# Patient Record
Sex: Female | Born: 1990 | Race: White | Hispanic: No | Smoking: Current every day smoker
Health system: Southern US, Community
[De-identification: ages and names within clinical notes are randomized; demographics above are authoritative.]

## PROBLEM LIST (undated history)

## (undated) ENCOUNTER — Inpatient Hospital Stay: Payer: Self-pay

## (undated) ENCOUNTER — Emergency Department: Admission: EM

## (undated) DIAGNOSIS — E538 Deficiency of other specified B group vitamins: Secondary | ICD-10-CM

## (undated) DIAGNOSIS — R569 Unspecified convulsions: Secondary | ICD-10-CM

## (undated) DIAGNOSIS — F419 Anxiety disorder, unspecified: Secondary | ICD-10-CM

## (undated) DIAGNOSIS — N760 Acute vaginitis: Secondary | ICD-10-CM

## (undated) DIAGNOSIS — Z8759 Personal history of other complications of pregnancy, childbirth and the puerperium: Secondary | ICD-10-CM

## (undated) DIAGNOSIS — B9689 Other specified bacterial agents as the cause of diseases classified elsewhere: Secondary | ICD-10-CM

## (undated) DIAGNOSIS — N809 Endometriosis, unspecified: Secondary | ICD-10-CM

## (undated) DIAGNOSIS — G40909 Epilepsy, unspecified, not intractable, without status epilepticus: Secondary | ICD-10-CM

## (undated) DIAGNOSIS — D649 Anemia, unspecified: Secondary | ICD-10-CM

## (undated) DIAGNOSIS — A599 Trichomoniasis, unspecified: Secondary | ICD-10-CM

## (undated) HISTORY — DX: Acute vaginitis: B96.89

## (undated) HISTORY — PX: OTHER SURGICAL HISTORY: SHX169

## (undated) HISTORY — DX: Anemia, unspecified: D64.9

## (undated) HISTORY — DX: Acute vaginitis: N76.0

## (undated) HISTORY — PX: NO PAST SURGERIES: SHX2092

## (undated) HISTORY — DX: Trichomoniasis, unspecified: A59.9

## (undated) HISTORY — DX: Personal history of other complications of pregnancy, childbirth and the puerperium: Z87.59

## (undated) HISTORY — PX: WISDOM TOOTH EXTRACTION: SHX21

## (undated) NOTE — *Deleted (*Deleted)
I value your feedback and entrusting us with your care. If you get a [facility] patient survey, I would appreciate you taking the time to let us know about your experience today. Thank you!  As of October 16, 2019, your lab results will be released to your MyChart immediately, before I even have a chance to see them. Please give me time to review them and contact you if there are any abnormalities. Thank you for your patience.  

---
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
## 2010-01-23 ENCOUNTER — Emergency Department: Payer: Self-pay | Admitting: Unknown Physician Specialty

## 2010-01-24 ENCOUNTER — Emergency Department: Payer: Self-pay | Admitting: Emergency Medicine

## 2010-11-25 ENCOUNTER — Emergency Department: Payer: Self-pay | Admitting: Emergency Medicine

## 2011-03-02 ENCOUNTER — Emergency Department: Payer: Self-pay | Admitting: Emergency Medicine

## 2011-04-06 ENCOUNTER — Emergency Department: Payer: Self-pay | Admitting: Emergency Medicine

## 2011-08-18 ENCOUNTER — Emergency Department: Payer: Self-pay | Admitting: Emergency Medicine

## 2011-11-05 ENCOUNTER — Observation Stay: Payer: Self-pay

## 2011-12-03 ENCOUNTER — Observation Stay: Payer: Self-pay | Admitting: Obstetrics and Gynecology

## 2011-12-03 LAB — URINALYSIS, COMPLETE
Bacteria: NONE SEEN
Blood: NEGATIVE
Ketone: NEGATIVE
Leukocyte Esterase: NEGATIVE
Ph: 7 (ref 4.5–8.0)
RBC,UR: <1 /HPF (ref 0–5)
Specific Gravity: 1.003 (ref 1.003–1.030)
Squamous Epithelial: <1
WBC UR: <1 /HPF (ref 0–5)

## 2011-12-17 ENCOUNTER — Observation Stay: Payer: Self-pay | Admitting: Obstetrics and Gynecology

## 2011-12-17 LAB — URINALYSIS, COMPLETE
Bacteria: NONE SEEN
Bilirubin,UR: NEGATIVE
Blood: NEGATIVE
Ketone: NEGATIVE
Leukocyte Esterase: NEGATIVE
Nitrite: NEGATIVE
Ph: 7 (ref 4.5–8.0)
RBC,UR: NONE SEEN /HPF (ref 0–5)
Specific Gravity: 1.002 (ref 1.003–1.030)
Squamous Epithelial: <1
WBC UR: 1 /HPF (ref 0–5)

## 2011-12-19 ENCOUNTER — Observation Stay: Payer: Self-pay

## 2011-12-23 ENCOUNTER — Observation Stay: Payer: Self-pay

## 2012-01-04 ENCOUNTER — Observation Stay: Payer: Self-pay | Admitting: Obstetrics & Gynecology

## 2012-01-06 ENCOUNTER — Observation Stay: Payer: Self-pay | Admitting: Obstetrics and Gynecology

## 2012-01-07 ENCOUNTER — Inpatient Hospital Stay: Payer: Self-pay

## 2012-01-07 LAB — CBC WITH DIFFERENTIAL/PLATELET
Basophil #: 0 10*3/uL (ref 0.0–0.1)
Eosinophil %: 0.6 %
HGB: 10.9 g/dL — ABNORMAL LOW (ref 12.0–16.0)
Lymphocyte #: 1.5 10*3/uL (ref 1.0–3.6)
Lymphocyte %: 9.8 %
Monocyte #: 1.2 10*3/uL — ABNORMAL HIGH (ref 0.0–0.7)
Monocyte %: 8.1 %
Neutrophil #: 12.5 10*3/uL — ABNORMAL HIGH (ref 1.4–6.5)
Neutrophil %: 81.3 %
Platelet: 171 10*3/uL (ref 150–440)
RBC: 3.22 10*6/uL — ABNORMAL LOW (ref 3.80–5.20)
RDW: 13.9 % (ref 11.5–14.5)
WBC: 15.3 10*3/uL — ABNORMAL HIGH (ref 3.6–11.0)

## 2012-01-09 LAB — HEMATOCRIT: HCT: 31 % — ABNORMAL LOW (ref 35.0–47.0)

## 2012-09-29 ENCOUNTER — Emergency Department: Payer: Self-pay | Admitting: Emergency Medicine

## 2012-09-29 LAB — URINALYSIS, COMPLETE
Bilirubin,UR: NEGATIVE
Glucose,UR: NEGATIVE mg/dL (ref 0–75)
Ketone: NEGATIVE
Leukocyte Esterase: NEGATIVE
Nitrite: NEGATIVE
Ph: 7 (ref 4.5–8.0)
Protein: NEGATIVE
RBC,UR: 4 /HPF (ref 0–5)
Squamous Epithelial: <1
WBC UR: 2 /HPF (ref 0–5)

## 2012-09-29 LAB — COMPREHENSIVE METABOLIC PANEL
Albumin: 4.2 g/dL (ref 3.4–5.0)
Alkaline Phosphatase: 92 U/L (ref 50–136)
Anion Gap: 4 — ABNORMAL LOW (ref 7–16)
BUN: 10 mg/dL (ref 7–18)
Bilirubin,Total: 0.5 mg/dL (ref 0.2–1.0)
EGFR (Non-African Amer.): >60
Glucose: 87 mg/dL (ref 65–99)
Osmolality: 274 (ref 275–301)
Potassium: 4.1 mmol/L (ref 3.5–5.1)
SGPT (ALT): 35 U/L (ref 12–78)
Sodium: 138 mmol/L (ref 136–145)
Total Protein: 7.6 g/dL (ref 6.4–8.2)

## 2012-09-29 LAB — CBC
MCH: 33.6 pg (ref 26.0–34.0)
MCV: 95 fL (ref 80–100)
Platelet: 252 10*3/uL (ref 150–440)
RBC: 4.28 10*6/uL (ref 3.80–5.20)
RDW: 12.2 % (ref 11.5–14.5)

## 2012-09-29 LAB — LIPASE, BLOOD: Lipase: 78 U/L (ref 73–393)

## 2012-09-29 LAB — WET PREP, GENITAL

## 2012-10-05 ENCOUNTER — Emergency Department: Payer: Self-pay | Admitting: Emergency Medicine

## 2012-10-05 LAB — URINALYSIS, COMPLETE
Glucose,UR: NEGATIVE mg/dL (ref 0–75)
Ph: 5 (ref 4.5–8.0)
Protein: NEGATIVE
RBC,UR: 3 /HPF (ref 0–5)
Specific Gravity: 1.031 (ref 1.003–1.030)
WBC UR: 5 /HPF (ref 0–5)

## 2012-10-05 LAB — BASIC METABOLIC PANEL
Anion Gap: 7 (ref 7–16)
BUN: 14 mg/dL (ref 7–18)
Co2: 26 mmol/L (ref 21–32)
Creatinine: 0.64 mg/dL (ref 0.60–1.30)
EGFR (African American): >60
Glucose: 114 mg/dL — ABNORMAL HIGH (ref 65–99)
Osmolality: 283 (ref 275–301)
Sodium: 141 mmol/L (ref 136–145)

## 2012-10-05 LAB — CBC
HGB: 14.3 g/dL (ref 12.0–16.0)
MCHC: 34.7 g/dL (ref 32.0–36.0)
Platelet: 240 10*3/uL (ref 150–440)
RDW: 12.3 % (ref 11.5–14.5)
WBC: 15.2 10*3/uL — ABNORMAL HIGH (ref 3.6–11.0)

## 2013-01-21 ENCOUNTER — Emergency Department: Payer: Self-pay | Admitting: Emergency Medicine

## 2014-03-08 ENCOUNTER — Emergency Department: Payer: Self-pay | Admitting: Emergency Medicine

## 2014-10-08 ENCOUNTER — Ambulatory Visit: Payer: Self-pay | Admitting: Emergency Medicine

## 2014-10-12 LAB — WOUND CULTURE

## 2014-10-17 ENCOUNTER — Emergency Department: Payer: Self-pay | Admitting: Emergency Medicine

## 2014-10-21 ENCOUNTER — Encounter: Payer: Self-pay | Admitting: Emergency Medicine

## 2014-11-06 ENCOUNTER — Encounter: Payer: Self-pay | Admitting: Emergency Medicine

## 2015-03-16 NOTE — H&P (Signed)
L&D Evaluation:  History Expanded:   HPI 24yo G3P0 who presents with stronger contractions and is pain, more than before, SROM in L and D,now 4 cm    Gravida 3    Term 0    PreTerm 0    Abortion 2    Living 0    Blood Type O positive    Group B Strep Results (Result >5wks must be treated as unknown) positive    Maternal HIV Negative    Maternal Syphilis Ab Nonreactive    Maternal Varicella Immune    Rubella Results immune    Maternal T-Dap Immune    [facility]    Presents with contractions, leaking fluid    Patient's Medical History psuedoseizure activity since 2008    Patient's Surgical History none    Medications Pre Natal Vitamins    Allergies NKDA    Social History tobacco    Family History Non-Contributory   ROS:   ROS All systems were reviewed.  HEENT, CNS, GI, GU, Respiratory, CV, Renal and Musculoskeletal systems were found to be normal.   Exam:   Vital Signs stable    Urine Protein not completed    General no apparent distress    Mental Status clear    Chest clear    Heart normal sinus rhythm    Abdomen gravid, non-tender    Estimated Fetal Weight Small for gestational age    Fetal Position vertex,4cm 100%    Back no CVAT    Edema no edema    Reflexes 1+    Pelvic no external lesions    Mebranes Intact    FHT normal rate with no decels    Fetal Heart Rate 140    Ucx regular    Ucx Frequency 5 min    Skin dry    Lymph no lymphadenopathy   Impression:   Impression active labor   Plan:   Plan antibiotics for GBBS prophylaxis    Comments will do pitocin to augment labor if she is staLLED AFTER  the amp    Follow Up Appointment need to schedule   Electronic Signatures: Adria DevonKlett, Legrande Hao (MD)  (Signed 04-Mar-13 01:57)  Authored: L&D Evaluation   Last Updated: 04-Mar-13 01:57 by Adria DevonKlett, Jowan Skillin (MD)

## 2015-03-16 NOTE — H&P (Signed)
L&D Evaluation:  History Expanded:   HPI 24yo G3P0 who presents with sronger contractions than normal. she seems to be affected by them, but she is the same on cervical ceck as in the office. she felt a gush of fluid in the shower that was a different temperature thatn the shower and was non colored. she had wiped one tme and saw pink.    Gravida 3    Term 0    PreTerm 0    Abortion 2    Living 0    Blood Type O positive    Group B Strep Results (Result >5wks must be treated as unknown) positive    Maternal HIV Negative    Maternal Syphilis Ab Nonreactive    Maternal Varicella Immune    Rubella Results immune    Maternal T-Dap Immune    [facility]    Presents with contractions, leaking fluid    Patient's Medical History psuedoseizure activity since 2008    Patient's Surgical History none    Medications Pre Natal Vitamins    Allergies NKDA   ROS:   ROS All systems were reviewed.  HEENT, CNS, GI, GU, Respiratory, CV, Renal and Musculoskeletal systems were found to be normal.   Exam:   Vital Signs stable    Urine Protein not completed    General no apparent distress    Mental Status clear    Chest clear    Heart normal sinus rhythm    Abdomen gravid, non-tender    Estimated Fetal Weight Small for gestational age    Fetal Position vertex, 2 cm/7980forebag felt fetus at -3    Back no CVAT    Edema no edema     Reflexes 1+     Pelvic no external lesions    Mebranes Intact    FHT normal rate with no decels    Fetal Heart Rate 140     Ucx irregular    Ucx Frequency 12 min    Skin dry    Lymph no lymphadenopathy    Impression:   Impression early labor   Plan:   Comments offeredpatient morphine or phenergan or zofran she chooses to have zofran to stop the nausea she is having. pt is in early labor and will be discharged to home with zofran, real labor precautions given    Follow Up Appointment need to schedule   Electronic  Signatures: Adria DevonKlett, Jihan Mellette (MD)  (Signed 02-Mar-13 12:16)  Authored: L&D Evaluation   Last Updated: 02-Mar-13 12:16 by Adria DevonKlett, Dimitris Shanahan (MD)

## 2015-03-16 NOTE — H&P (Signed)
L&D Evaluation:  History Expanded:   HPI 24yo G3P0 who presents with stronger contractions and is pain, more than before. she seems to be affected by them, but she is the same on cervical check as this morning. she felt a gush of fluid in the shower that was a different temperature than the shower and was non colored it happened this morning and happened this afternoon again. bag is palpated and is intact.. she had wiped one time and saw pink.    Gravida 3    Term 0    PreTerm 0    Abortion 2    Living 0    Blood Type O positive    Group B Strep Results (Result >5wks must be treated as unknown) positive    Maternal HIV Negative    Maternal Syphilis Ab Nonreactive    Maternal Varicella Immune    Rubella Results immune    Maternal T-Dap Immune    [facility]    Presents with contractions, leaking fluid    Patient's Medical History psuedoseizure activity since 2008    Patient's Surgical History none    Medications Pre Natal Vitamins    Allergies NKDA    Social History tobacco    Family History Non-Contributory   ROS:   ROS All systems were reviewed.  HEENT, CNS, GI, GU, Respiratory, CV, Renal and Musculoskeletal systems were found to be normal.   Exam:   Vital Signs stable    Urine Protein not completed    General no apparent distress    Mental Status clear    Chest clear    Heart normal sinus rhythm    Abdomen gravid, non-tender    Estimated Fetal Weight Small for gestational age    Fetal Position vertex, 2 cm/80 forebag felt fetus at -3    Back no CVAT    Edema no edema    Reflexes 1+    Pelvic no external lesions    Mebranes Intact    FHT normal rate with no decels    Fetal Heart Rate 140    Ucx irregular    Ucx Frequency 12 min    Skin dry    Lymph no lymphadenopathy   Impression:   Impression early labor   Plan:   Comments offered patient morphine or phenergan or zofran she chooses to have zofran to stop the nausea  she is having. The patientss mother does a alot of the talking for her and tells her what she does and does not want.    Follow Up Appointment need to schedule   Electronic Signatures: Adria DevonKlett, Marymargaret Kirker (MD)  (Signed 02-Mar-13 22:40)  Authored: L&D Evaluation   Last Updated: 02-Mar-13 22:40 by Adria DevonKlett, Wilbur Oakland (MD)

## 2015-03-16 NOTE — H&P (Signed)
L&D Evaluation:  History:   HPI 24 yo G3P0020 @ 74101w6d GA by LMP c/w 10 week u/s with an uncomplicated pregnancy. She presents with contractions of increasing frequency and severity since yesterday at noon.  Notes +FM, no LOF, no VB. O+, Pap = ASC+, RI, RPR NR, HBsAg neg, VI, GBS unk.    Presents with contractions    Patient's Medical History No Chronic Illness    Patient's Surgical History none    Medications Pre Natal Vitamins    Allergies NKDA    Social History tobacco    Family History Non-Contributory   ROS:   ROS All systems were reviewed.  HEENT, CNS, GI, GU, Respiratory, CV, Renal and Musculoskeletal systems were found to be normal., Denies urinary and vaginal symptoms   Exam:   Vital Signs stable    General no apparent distress    Mental Status clear    Chest clear    Heart normal sinus rhythm    Abdomen gravid, non-tender    Back no CVAT    Edema no edema    Pelvic FT/Long/-3, negative pooling    Mebranes Intact    FHT normal rate with no decels    FHT Description 130/mod var/+accels/no decels    Ucx irregular, infrequent    Skin no lesions    Other UA: negative for infection. pH 7.0, neg LE, neg nit, 1 epi, no bact Wet mount: neg, KOH: no fungal elements negative pooling and ferning, positive nitrazine (see urine pH)   Impression:   Impression reactive NST, Uterine contractions, no evidence of labor   Plan:   Plan UA, EFM/NST, discharge    Comments Of note: patient thought she had a gush of fluid.  Nitrazine was positive. She immediately had to urinate. No evidence of ROM. Home with precautions.    Follow Up Appointment already scheduled. on Wednesday 2/13   Electronic Signatures: Conard NovakJackson, Zackariah Vanderpol D (MD)  (Signed 10-Feb-13 17:08)  Authored: L&D Evaluation   Last Updated: 10-Feb-13 17:08 by Conard NovakJackson, Steele Stracener D (MD)

## 2016-01-05 ENCOUNTER — Encounter: Payer: Self-pay | Admitting: Emergency Medicine

## 2016-01-05 ENCOUNTER — Emergency Department: Payer: Self-pay

## 2016-01-05 ENCOUNTER — Emergency Department
Admission: EM | Admit: 2016-01-05 | Discharge: 2016-01-05 | Disposition: A | Discharge status: Home / Self Care | Payer: Self-pay | Attending: Emergency Medicine | Admitting: Emergency Medicine

## 2016-01-05 DIAGNOSIS — S6291XA Unspecified fracture of right wrist and hand, initial encounter for closed fracture: Secondary | ICD-10-CM

## 2016-01-05 DIAGNOSIS — Y9389 Activity, other specified: Secondary | ICD-10-CM | POA: Insufficient documentation

## 2016-01-05 DIAGNOSIS — S62316A Displaced fracture of base of fifth metacarpal bone, right hand, initial encounter for closed fracture: Secondary | ICD-10-CM | POA: Insufficient documentation

## 2016-01-05 DIAGNOSIS — W231XXA Caught, crushed, jammed, or pinched between stationary objects, initial encounter: Secondary | ICD-10-CM | POA: Insufficient documentation

## 2016-01-05 DIAGNOSIS — F17200 Nicotine dependence, unspecified, uncomplicated: Secondary | ICD-10-CM | POA: Insufficient documentation

## 2016-01-05 DIAGNOSIS — Y92009 Unspecified place in unspecified non-institutional (private) residence as the place of occurrence of the external cause: Secondary | ICD-10-CM | POA: Insufficient documentation

## 2016-01-05 DIAGNOSIS — Y998 Other external cause status: Secondary | ICD-10-CM | POA: Insufficient documentation

## 2016-01-05 DIAGNOSIS — S62309A Unspecified fracture of unspecified metacarpal bone, initial encounter for closed fracture: Secondary | ICD-10-CM

## 2016-01-05 MED ORDER — OXYCODONE-ACETAMINOPHEN 5-325 MG PO TABS
ORAL_TABLET | ORAL | Status: AC
  Filled 2016-01-05: qty 1

## 2016-01-05 MED ORDER — HYDROCODONE-ACETAMINOPHEN 5-325 MG PO TABS: 1.0000 | ORAL_TABLET | Freq: Four times a day (QID) | ORAL | Status: DC | PRN

## 2016-01-05 MED ORDER — OXYCODONE-ACETAMINOPHEN 5-325 MG PO TABS
1.0000 | ORAL_TABLET | Freq: Once | ORAL | Status: AC
  Administered 2016-01-05: 1 via ORAL

## 2016-01-05 NOTE — ED Notes (Signed)
Slipped in dog pee and hit hand on table, c/o right hand pain.

## 2016-01-05 NOTE — ED Notes (Signed)
Splint applied per order. Patient in stable condition. Cap refill <2 sec; palpable pulse. Education regarding s/sx of adverse reaction complete; patient verbalizes understanding of need to remove splint and seek medical attention for severe pain, tingling, numbness, distal swelling, pallor, coldness, or cyanosis. 

## 2016-01-05 NOTE — Discharge Instructions (Signed)
Cast or Splint Care °Casts and splints support injured limbs and keep bones from moving while they heal.  °HOME CARE °· Keep the cast or splint uncovered during the drying period. °¨ A plaster cast can take 24 to 48 hours to dry. °¨ A fiberglass cast will dry in less than 1 hour. °· Do not rest the cast on anything harder than a pillow for 24 hours. °· Do not put weight on your injured limb. Do not put pressure on the cast. Wait for your doctor's approval. °· Keep the cast or splint dry. °¨ Cover the cast or splint with a plastic bag during baths or wet weather. °¨ If you have a cast over your chest and belly (trunk), take sponge baths until the cast is taken off. °¨ If your cast gets wet, dry it with a towel or blow dryer. Use the cool setting on the blow dryer. °· Keep your cast or splint clean. Wash a dirty cast with a damp cloth. °· Do not put any objects under your cast or splint. °· Do not scratch the skin under the cast with an object. If itching is a problem, use a blow dryer on a cool setting over the itchy area. °· Do not trim or cut your cast. °· Do not take out the padding from inside your cast. °· Exercise your joints near the cast as told by your doctor. °· Raise (elevate) your injured limb on 1 or 2 pillows for the first 1 to 3 days. °GET HELP IF: °· Your cast or splint cracks. °· Your cast or splint is too tight or too loose. °· You itch badly under the cast. °· Your cast gets wet or has a soft spot. °· You have a bad smell coming from the cast. °· You get an object stuck under the cast. °· Your skin around the cast becomes red or sore. °· You have new or more pain after the cast is put on. °GET HELP RIGHT AWAY IF: °· You have fluid leaking through the cast. °· You cannot move your fingers or toes. °· Your fingers or toes turn blue or white or are cool, painful, or puffy (swollen). °· You have tingling or lose feeling (numbness) around the injured area. °· You have bad pain or pressure under the  cast. °· You have trouble breathing or have shortness of breath. °· You have chest pain. °  °This information is not intended to replace advice given to you by your health care provider. Make sure you discuss any questions you have with your health care provider. °  °Document Released: 02/22/2011 Document Revised: 06/25/2013 Document Reviewed: 05/01/2013 °Elsevier Interactive Patient Education ©2016 [employer] ° °Metacarpal Fracture °Fractures of metacarpals are breaks in the bones of the hand. They extend from the knuckles to the wrist. These bones can break in many ways. There are different ways of treating these fractures. °HOME CARE °· Only exercise as told by your doctor. °· Return to activities as told by your doctor. °· Go to physical therapy as told by your doctor. °· Follow your doctor's advice about driving. °· Keep the injured hand raised (elevated) above the level of your heart. °· If a plaster, fiberglass, or pre-formed splint was applied: °¨ Wear your splint as told and until you are examined again. °¨ Apply ice on the injury for 15-20 minutes at a time, 03-04 times a day. Put the ice in a plastic bag. Place a towel between your skin   and the bag.  Do not get your splint or cast wet. Protect it during bathing with a plastic bag.  Loosen the elastic bandage around the splint if your fingers start to get numb, tingle, get cold, or turn blue.  If the splint is plaster, do not lean it on hard surfaces or put pressure on it for 24 hours after it is put on.  Do not  try to scratch the skin under the cast.  Check the skin around the cast every day. You may put lotion on red or sore areas.  Move the fingers of your casted hand several times a day.  Only take medicine as told by your doctor.  Follow up as told by your doctor. This is very important in order to avoid permanent injury, disability, or lasting (chronic) pain. GET HELP RIGHT AWAY IF:   You develop a rash.  You have problems  breathing.  You have any allergy problems.  You have more than a small spot of blood from beneath your cast or splint.  You have redness, puffiness (swelling), or more pain from beneath your cast or splint.  Yellowish-white fluid (pus) comes from beneath your cast or splint.  You develop a temperature by mouth above 102 F (38.9 C), not controlled by medicine.  You have a bad smell coming from under your cast or splint.  You have problems moving any of your fingers. If you do not have a window in your cast for looking at the wound, a fluid or a little bleeding may show up as a stain on the outside of your cast. Tell your doctor about any stains you see. MAKE SURE YOU:   Understand these instructions.  Will watch your condition.  Will get help right away if you are not doing well or get worse.   This information is not intended to replace advice given to you by your health care provider. Make sure you discuss any questions you have with your health care provider.   Document Released: 04/10/2008 Document Revised: 11/13/2014 Document Reviewed: 08/12/2014 Elsevier Interactive Patient Education 2016 [employer].  Take the prescription meds as directed. Apply ice through the splint. Follow-up with Dr. Joice Lofts for further treatment.

## 2016-01-05 NOTE — ED Provider Notes (Signed)
[facility] Emergency Department Provider Note ____________________________________________  Time seen: 1241  I have reviewed the triage vital signs and the nursing notes.  HISTORY  Chief Complaint  Hand Pain  HPI Betty Powell is a 25 y.o. female visits to the ED for evaluation of pain to the right hand after an injury occurred at home today. He describes she slipped on a puddle of dog p.m. the floor, and slammed the side of her right hand against the coffee table. She noted immediate pain and disability to the hand and the lateral aspect. She presents to the ED for evaluation of hand pain, swelling, disability. She denies any other injury at this time.She rates the pain and had 6/10 in triage.  History reviewed. No pertinent past medical history.  There are no active problems to display for this patient.  History reviewed. No pertinent past surgical history.  Current Outpatient Rx  Name  Route  Sig  Dispense  Refill  . HYDROcodone-acetaminophen (NORCO) 5-325 MG tablet   Oral   Take 1 tablet by mouth every 6 (six) hours as needed for moderate pain.   15 tablet   0    Allergies Review of patient's allergies indicates no known allergies.  History reviewed. No pertinent family history.  Social History Social History  Substance Use Topics  . Smoking status: Current Every Day Smoker  . Smokeless tobacco: None  . Alcohol Use: None   Review of Systems  Constitutional: Negative for fever. Cardiovascular: Negative for chest pain. Respiratory: Negative for shortness of breath. Gastrointestinal: Negative for abdominal pain, vomiting and diarrhea. Musculoskeletal: Negative for back pain. Right hand pain as above.  Skin: Negative for rash. Neurological: Negative for headaches, focal weakness or numbness. ____________________________________________  PHYSICAL EXAM:  VITAL SIGNS: ED Triage Vitals  Enc Vitals Group     BP 01/05/16 1031 114/82  mmHg     Pulse Rate 01/05/16 1031 87     Resp 01/05/16 1031 18     Temp 01/05/16 1031 97.9 F (36.6 C)     Temp Source 01/05/16 1031 Oral     SpO2 01/05/16 1031 100 %     Weight 01/05/16 1031 125 lb (56.7 kg)     Height 01/05/16 1031  (1.651 m)     Head Cir --      Peak Flow --      Pain Score 01/05/16 1032 6     Pain Loc --      Pain Edu? --      Excl. in GC? --    Constitutional: Alert and oriented. Well appearing and in no distress. Head: Normocephalic and atraumatic. Eyes: Conjunctivae are normal. PERRL. Normal extraocular movements Hematological/Lymphatic/Immunological: No cervical lymphadenopathy. Cardiovascular: Normal rate, regular rhythm. Normal distal pulses. Respiratory: Normal respiratory effort.  Musculoskeletal: Patient right hand with obvious lateral swelling at the base of the fifth metacarpal. Nontender with normal range of motion in all extremities.  Neurologic: Cranial nerves II through XII grossly intact. Normal gross sensation intact. Normal gait without ataxia. Normal speech and language. No gross focal neurologic deficits are appreciated. Skin:  Skin is warm, dry and intact. No rash noted. Psychiatric: Mood and affect are normal. Patient exhibits appropriate insight and judgment. ___________________________________________   RADIOLOGY Righ hand IMPRESSION: Mildly displaced fracture through the proximal right fifth Metacarpal.  I, Nyra Anspaugh, Charlesetta Ivory, personally viewed and evaluated these images (plain radiographs) as part of my medical decision making, as well as reviewing the written  report by the radiologist. ____________________________________________  PROCEDURES  Percocet 5-325 mg PO Ulnar gutter splint ____________________________________________  INITIAL IMPRESSION / ASSESSMENT AND PLAN / ED COURSE  Patient with an acute closed fifth metacarpal fracture at the right hand. She is splinted appropriately initial fracture care provided.  Patient is discharged with a prescription for hydrocodone to dose as needed for pain relief. She will follow with Dr. Joice Lofts in 2-3 days for further fracture management. ____________________________________________  FINAL CLINICAL IMPRESSION(S) / ED DIAGNOSES  Final diagnoses:  Right hand fracture, closed, initial encounter  Metacarpal bone fracture, closed, initial encounter      Lissa Hoard, PA-C 01/05/16 1932  Jeanmarie Plant, MD 01/06/16 1125

## 2016-01-05 NOTE — ED Notes (Signed)
Having pain to right hand   States she hit it on table

## 2016-06-19 LAB — HM PAP SMEAR: HM Pap smear: NORMAL

## 2016-06-27 LAB — OB RESULTS CONSOLE ABO/RH: RH TYPE: POSITIVE

## 2016-06-27 LAB — OB RESULTS CONSOLE ANTIBODY SCREEN: Antibody Screen: NEGATIVE

## 2016-06-27 LAB — OB RESULTS CONSOLE HIV ANTIBODY (ROUTINE TESTING): HIV: NONREACTIVE

## 2016-06-27 LAB — OB RESULTS CONSOLE VARICELLA ZOSTER ANTIBODY, IGG: Varicella: IMMUNE

## 2016-06-27 LAB — OB RESULTS CONSOLE RUBELLA ANTIBODY, IGM: RUBELLA: IMMUNE

## 2016-06-27 LAB — OB RESULTS CONSOLE HEPATITIS B SURFACE ANTIGEN: Hepatitis B Surface Ag: NEGATIVE

## 2016-06-27 LAB — OB RESULTS CONSOLE RPR: RPR: NONREACTIVE

## 2016-10-17 ENCOUNTER — Observation Stay
Admission: EM | Admit: 2016-10-17 | Discharge: 2016-10-18 | Disposition: A | Discharge status: Home / Self Care | Payer: Medicaid Other | Attending: Obstetrics and Gynecology | Admitting: Obstetrics and Gynecology

## 2016-10-17 ENCOUNTER — Encounter: Payer: Self-pay | Admitting: *Deleted

## 2016-10-17 DIAGNOSIS — R1030 Lower abdominal pain, unspecified: Secondary | ICD-10-CM | POA: Insufficient documentation

## 2016-10-17 DIAGNOSIS — M545 Low back pain: Secondary | ICD-10-CM | POA: Diagnosis not present

## 2016-10-17 DIAGNOSIS — F17290 Nicotine dependence, other tobacco product, uncomplicated: Secondary | ICD-10-CM | POA: Insufficient documentation

## 2016-10-17 DIAGNOSIS — Z3A23 23 weeks gestation of pregnancy: Secondary | ICD-10-CM | POA: Diagnosis not present

## 2016-10-17 DIAGNOSIS — O99332 Smoking (tobacco) complicating pregnancy, second trimester: Secondary | ICD-10-CM | POA: Diagnosis not present

## 2016-10-17 DIAGNOSIS — O26892 Other specified pregnancy related conditions, second trimester: Secondary | ICD-10-CM | POA: Diagnosis not present

## 2016-10-17 HISTORY — DX: Anxiety disorder, unspecified: F41.9

## 2016-10-17 LAB — URINALYSIS, ROUTINE W REFLEX MICROSCOPIC
Bilirubin Urine: NEGATIVE
GLUCOSE, UA: NEGATIVE mg/dL
HGB URINE DIPSTICK: NEGATIVE
Ketones, ur: NEGATIVE mg/dL
LEUKOCYTES UA: NEGATIVE
Nitrite: NEGATIVE
PROTEIN: NEGATIVE mg/dL
SPECIFIC GRAVITY, URINE: 1.025 (ref 1.005–1.030)
pH: 6 (ref 5.0–8.0)

## 2016-10-17 LAB — WET PREP, GENITAL
Clue Cells Wet Prep HPF POC: NONE SEEN
Sperm: NONE SEEN
TRICH WET PREP: NONE SEEN
YEAST WET PREP: NONE SEEN

## 2016-10-17 NOTE — Final Progress Note (Signed)
Physician Final Progress Note  Patient ID: Betty Powell MRN: [id number] DOB/AGE: 25/03/1991 25 y.o.  Admit date: 10/17/2016 Admitting provider: Conard NovakStephen D Gustav Knueppel, MD Discharge date: 10/20/2016   Admission Diagnoses:  Lower abdominal pain Lower back pain  Discharge Diagnoses:  Lower abdominal pain Lower back pain  History of Present Illness: The patient is a 25 y.o. female [phone] at 6334w3d who presents for lower abdominal pain that started last night.  She states that she has been having some amount of lower abdominal pain for about a couple of weeks. However, today has been worse making it hard for her to work.  The pain is like cramping and tightening of her lower abdomen.  The pain does not radiate.  Lying still and on her side makes the pain feel better.  Movement and exertion makes it worse.  She also noted that while giving her dog a bath last night she felt a leakage of mucus-like fluid.  When she checked in her underwear she had a small amount of mucus.  She denies vaginal bleeding, and a gush of water-like thin fluid.  Notes +FM.  No fevers, chills, diarrhea, constipation, urinary or vaginal symptoms of itching, burning, or irritation.   After further workup no etiology was found. Most likely MSK.  Precautions discussed. Patient to keep routine f/u appointment.  Past Medical History:  Diagnosis Date  . Anxiety     Past Surgical History:  Procedure Laterality Date  . NO PAST SURGERIES      No current facility-administered medications on file prior to encounter.    Current Outpatient Prescriptions on File Prior to Encounter  Medication Sig Dispense Refill  . HYDROcodone-acetaminophen (NORCO) 5-325 MG tablet Take 1 tablet by mouth every 6 (six) hours as needed for moderate pain. (Patient not taking: Reported on 10/17/2016) 15 tablet 0    No Known Allergies  Social History   Social History  . Marital status: Married    Spouse name: N/A  . Number of children: N/A   . Years of education: N/A   Occupational History  . Not on file.   Social History Main Topics  . Smoking status: Current Every Day Smoker    Types: E-cigarettes  . Smokeless tobacco: Never Used  . Alcohol use No  . Drug use: No  . Sexual activity: Not on file   Other Topics Concern  . Not on file   Social History Narrative  . No narrative on file    Physical Exam: BP (!) 105/55 (BP Location: Left Arm)   Pulse 81   Temp 97.7 F (36.5 C) (Oral)   Resp 15   Ht 5\' 4"  (1.626 m)   Wt 144 lb (65.3 kg)   BMI 24.72 kg/m   Gen: NAD CV: RRR Pulm: CTAB Pelvic: Female chaperone present.  NEFG, vagina normally rugated and pink  Cervix is closed and not tender to palpation  SVE: closed, thick, and high Ext: no e/c/t  Consults: None  Significant Findings/ Diagnostic Studies:  Lab Results  Component Value Date   APPEARANCEUR CLEAR (A) 10/17/2016   GLUCOSEU NEGATIVE 10/17/2016   BILIRUBINUR NEGATIVE 10/17/2016   KETONESUR NEGATIVE 10/17/2016   LABSPEC 1.025 10/17/2016   HGBUR NEGATIVE 10/17/2016   PHURINE 6.0 10/17/2016   NITRITE NEGATIVE 10/17/2016   LEUKOCYTESUR NEGATIVE 10/17/2016   Lab Results  Component Value Date   TRICHWETPREP NONE SEEN 10/17/2016   CLUECELLS NONE SEEN 10/17/2016   WBCWETPREP FEW (A) 10/17/2016   YEASTWETPREP NONE SEEN  10/17/2016     Lab Results  Component Value Date   CHLAMYDIA NOT DETECTED 10/17/2016   NGONORRHOEAE NOT DETECTED 10/17/2016      Procedures: Normal fetal heart tones for gestational age  Discharge Condition: stable  Disposition: 01-Home or Self Care  Diet: Regular diet  Discharge Activity: Activity as tolerated   Allergies as of 10/18/2016   No Known Allergies     Medication List    ASK your doctor about these medications   buprenorphine 2 MG Subl SL tablet Commonly known as:  SUBUTEX Place 2 mg under the tongue daily.   HYDROcodone-acetaminophen 5-325 MG tablet Commonly known as:  NORCO Take 1 tablet  by mouth every 6 (six) hours as needed for moderate pain.      Follow-up Information    [facility] [place] [place].   Contact information: [address] [place] [address] [phone]           Total time spent taking care of this patient: 30 minutes  Signed: Conard NovakJackson, Betty Powell D, MD  10/20/2016, 7:33 AM

## 2016-10-17 NOTE — OB Triage Note (Signed)
Patient states while giving her dog a bath last night around 2300 she had a sharpe pain and some mucus discharge come out and ever since she has been having abdominal pain and pressure, denies LOF, vaginal bleeding or any other concerns. Pt states baby is active.

## 2016-10-18 DIAGNOSIS — O26892 Other specified pregnancy related conditions, second trimester: Secondary | ICD-10-CM | POA: Diagnosis not present

## 2016-10-18 LAB — CHLAMYDIA/NGC RT PCR (ARMC ONLY)
CHLAMYDIA TR: NOT DETECTED
N gonorrhoeae: NOT DETECTED

## 2016-10-18 NOTE — OB Triage Note (Signed)
Discharge instructions and preterm labor precautions provided to pt verbal and written. Advised pt to rest, avoid prolonged standing, heavy lifting or strenuous activity at home and work. 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 States pain has eased since being in triage, rates abd and back pain 2/10 now, tolerable. Discussed taking otc Tylenol if needed for mild discomfort and advised pt to call MD or return to hospital if having worsening pain or preterm labor signs/symptoms. Pt verbalized understanding, agrees she now feels fine to go home. Encouraged pt to drink fluids, take warm shower and wear pregnancy support belt when needed. Pt agrees to f/u in OB office as scheduled tomorrow and will discuss need for work note to discuss if any restrictions may be needed while at work.

## 2016-10-20 NOTE — Discharge Summary (Signed)
See final progress note. 

## 2016-11-05 ENCOUNTER — Encounter: Payer: Self-pay | Admitting: Emergency Medicine

## 2016-11-05 ENCOUNTER — Emergency Department: Payer: Medicaid Other

## 2016-11-05 ENCOUNTER — Emergency Department
Admission: EM | Admit: 2016-11-05 | Discharge: 2016-11-06 | Disposition: A | Discharge status: Home / Self Care | Payer: Medicaid Other | Attending: Emergency Medicine | Admitting: Emergency Medicine

## 2016-11-05 DIAGNOSIS — R519 Headache, unspecified: Secondary | ICD-10-CM

## 2016-11-05 DIAGNOSIS — Z3A27 27 weeks gestation of pregnancy: Secondary | ICD-10-CM | POA: Insufficient documentation

## 2016-11-05 DIAGNOSIS — F17210 Nicotine dependence, cigarettes, uncomplicated: Secondary | ICD-10-CM | POA: Diagnosis not present

## 2016-11-05 DIAGNOSIS — J0130 Acute sphenoidal sinusitis, unspecified: Secondary | ICD-10-CM | POA: Diagnosis not present

## 2016-11-05 DIAGNOSIS — O99512 Diseases of the respiratory system complicating pregnancy, second trimester: Secondary | ICD-10-CM | POA: Diagnosis not present

## 2016-11-05 DIAGNOSIS — O99332 Smoking (tobacco) complicating pregnancy, second trimester: Secondary | ICD-10-CM | POA: Diagnosis not present

## 2016-11-05 DIAGNOSIS — R51 Headache: Secondary | ICD-10-CM

## 2016-11-05 LAB — URINALYSIS, COMPLETE (UACMP) WITH MICROSCOPIC
BILIRUBIN URINE: NEGATIVE
Bacteria, UA: NONE SEEN
Glucose, UA: NEGATIVE mg/dL
Hgb urine dipstick: NEGATIVE
Ketones, ur: NEGATIVE mg/dL
Leukocytes, UA: NEGATIVE
Nitrite: NEGATIVE
PH: 6 (ref 5.0–8.0)
Protein, ur: NEGATIVE mg/dL
RBC / HPF: NONE SEEN RBC/hpf (ref 0–5)
SPECIFIC GRAVITY, URINE: 1.027 (ref 1.005–1.030)

## 2016-11-05 MED ORDER — DIPHENHYDRAMINE HCL 50 MG/ML IJ SOLN
25.0000 mg | Freq: Once | INTRAMUSCULAR | Status: AC
  Administered 2016-11-05: 25 mg via INTRAVENOUS
  Filled 2016-11-05: qty 1

## 2016-11-05 MED ORDER — SODIUM CHLORIDE 0.9 % IV BOLUS (SEPSIS)
1000.0000 mL | Freq: Once | INTRAVENOUS | Status: AC
  Administered 2016-11-05: 1000 mL via INTRAVENOUS

## 2016-11-05 NOTE — ED Notes (Signed)
Pt transported to MRI 

## 2016-11-05 NOTE — ED Triage Notes (Signed)
Pt to ED with c/o of migraine that started yesterday. Pt has no hx of migraines and is approx [redacted] weeks pregnant. Pt has had multiple spells of lightheadedness and vision changes.

## 2016-11-05 NOTE — ED Notes (Signed)
Pt upset that she still hasn't been seen by dr. Vernona RiegerApologized. Let her know her urine was good with no protein, and that her vss while she has been here. Offered her and her husband food and water while they wait.

## 2016-11-05 NOTE — ED Notes (Signed)
FIRST NURSE NOTE: Migraine HA x 3 days, pt is [redacted] weeks pregnant, [facility].

## 2016-11-05 NOTE — ED Provider Notes (Signed)
[facility] Emergency Department Provider Note ____________________________________________   I have reviewed the triage vital signs and the triage nursing note.  HISTORY  Chief Complaint Migraine   Historian Patient  HPI Betty Powell is a 25 y.o. female who is approximately [redacted] weeks pregnantpresents for evaluation today due to headache. Headache started 2 nights ago when she was in group therapy and states it was gradual in onset and she just went to sleep. Yesterday she woke up and still had the headache which was global and she had progression of worsening of headache over the course of the day. She worked yesterday although she doesn't report any specific stressors. She has reported occasional dizziness and seeing spots. She woke up this morning he had persistent severe headache and called her OB/GYN who recommended she come to the ED for further evaluation.  No history of prior headaches are chronic headaches or migraine headaches.  Here in the emergency department she actually has had some improvement of the headache which is only now 4 out of 10. She is not complaining of any focal weakness or numbness complaints. No neck stiffness. No fevers. She's had some sinus congestion and sinus pressure and is questioning whether her headache may be sinus related.    Past Medical History:  Diagnosis Date  . Anxiety     Patient Active Problem List   Diagnosis Date Noted  . Labor and delivery, indication for care 10/17/2016    Past Surgical History:  Procedure Laterality Date  . NO PAST SURGERIES      Prior to Admission medications   Medication Sig Start Date End Date Taking? Authorizing Provider  buprenorphine (SUBUTEX) 2 MG SUBL SL tablet Place 2 mg under the tongue daily.    Historical Provider, MD  HYDROcodone-acetaminophen (NORCO) 5-325 MG tablet Take 1 tablet by mouth every 6 (six) hours as needed for moderate pain. Patient not taking: Reported  on 10/17/2016 01/05/16   Charlesetta IvoryJenise V Bacon Menshew, PA-C    No Known Allergies  History reviewed. No pertinent family history.  Social History Social History  Substance Use Topics  . Smoking status: Current Every Day Smoker    Types: E-cigarettes  . Smokeless tobacco: Never Used  . Alcohol use No    Review of Systems  Constitutional: Negative for fever. Eyes: Negative for visual changes. ENT: Negative for sore throat. Cardiovascular: Negative for chest pain. Respiratory: Negative for shortness of breath. Gastrointestinal: Negative for abdominal pain, vomiting and diarrhea. Genitourinary: Negative for dysuria. Musculoskeletal: Negative for back pain. Skin: Negative for rash. Neurological: Positive for headache. 10 point Review of Systems otherwise negative ____________________________________________   PHYSICAL EXAM:  VITAL SIGNS: ED Triage Vitals [11/05/16 1713]  Enc Vitals Group     BP (!) 112/58     Pulse Rate 89     Resp 18     Temp 98.5 F (36.9 C)     Temp Source Oral     SpO2 98 %     Weight 150 lb (68 kg)     Height 5\' 4"  (1.626 m)     Head Circumference      Peak Flow      Pain Score 9     Pain Loc      Pain Edu?      Excl. in GC?      Constitutional: Alert and oriented. Well appearing and in no distress. HEENT   Head: Normocephalic and atraumatic.      Eyes: Conjunctivae  are normal. PERRL. Normal extraocular movements.  Funduscopic exam normal bilaterally.      Ears:         Nose: No congestion/rhinnorhea.   Mouth/Throat: Mucous membranes are moist.   Neck: No stridor.  Supple and no stiffness. Cardiovascular/Chest: Normal rate, regular rhythm.  No murmurs, rubs, or gallops. Respiratory: Normal respiratory effort without tachypnea nor retractions. Breath sounds are clear and equal bilaterally. No wheezes/rales/rhonchi. Gastrointestinal: Soft. No distention, no guarding, no rebound. Gravid above the umbilicus.   Genitourinary/rectal:Deferred Musculoskeletal: Nontender with normal range of motion in all extremities. No joint effusions.  No lower extremity tenderness.  No edema. Neurologic:  Normal speech and language.  No facial droop. Cranial nerves II through X intact. 5 out of 5 strength in 4 extremities. No pronator drift. Finger-nose intact bilaterally. No gross or focal neurologic deficits are appreciated. Skin:  Skin is warm, dry and intact. No rash noted. Psychiatric: Mood and affect are normal. Speech and behavior are normal. Patient exhibits appropriate insight and judgment.   ____________________________________________  LABS (pertinent positives/negatives)  Labs Reviewed  URINALYSIS, COMPLETE (UACMP) WITH MICROSCOPIC - Abnormal; Notable for the following:       Result Value   Color, Urine YELLOW (*)    APPearance CLEAR (*)    Squamous Epithelial / LPF 0-5 (*)    All other components within normal limits    ____________________________________________    EKG I, Governor Rooksebecca Katelind Pytel, MD, the attending physician have personally viewed and interpreted all ECGs.  None ____________________________________________  RADIOLOGY All Xrays were viewed by me. Imaging interpreted by Radiologist.  MRI brain without contrast and MRV brain: Pending __________________________________________  PROCEDURES  Procedure(s) performed: None  Critical Care performed: None  ____________________________________________   ED COURSE / ASSESSMENT AND PLAN  Pertinent labs & imaging results that were available during my care of the patient were reviewed by me and considered in my medical decision making (see chart for details).    Ms. Clarisa FlingDowell is here for evaluation of headache which is very unusual for her and associated with some occasional dizziness and spots in her vision, but on exam now she is actually having improvement and has a nonfocal and intact neurologic exam.  However given the intensity  and. Unusual headache for her, and risk factor pregnancy in terms of thrombosis, I did recommend CT head imaging. She does not want to do a head CT and refuses head CT imaging. I did recommend MRI brain without as well as MRV imaging and she is comfortable with this.  I did give her 1 L normal saline and Benadryl to help with nonspecific headache.  Patient care transferred to Dr. Manson PasseyBrown at 11 PM shift change. MRI imaging is pending.  If negative I would anticipate discharge home.    CONSULTATIONS:   None   Patient / Family / Caregiver informed of clinical course, medical decision-making process, and agree with plan.   I discussed return precautions, follow-up instructions, and discharge instructions with patient and/or family.   ___________________________________________   FINAL CLINICAL IMPRESSION(S) / ED DIAGNOSES   Final diagnoses:  Headache              Note: This dictation was prepared with Dragon dictation. Any transcriptional errors that result from this process are unintentional    Governor Rooksebecca Judea Fennimore, MD 11/05/16 [phone]

## 2016-11-05 NOTE — Discharge Instructions (Addendum)
From Dr. Shaune PollackLord:  Return to the emergency department for any worsening condition including worsening uncontrolled headache, confusion or altered mental status, seizure, weakness, numbness, fever, or any other symptoms concerning to you.

## 2016-11-06 MED ORDER — AMOXICILLIN-POT CLAVULANATE 875-125 MG PO TABS: 1.0000 | ORAL_TABLET | Freq: Two times a day (BID) | ORAL | 0 refills | Status: AC

## 2016-11-06 NOTE — L&D Delivery Note (Signed)
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  Apgars:8 ,9  Weight:  8 lb 0 oz .   Placenta status: spontaneous and Intact.  Cord: 3+ vessels;  with the following complications: none.  Anesthesia:  none Episiotomy:  none Lacerations:  right vaginal side wall small lac Suture Repair: none Est. Blood Loss (mL):  less than 100 mL  Mom to postpartum.  Baby to Couplet care / Skin to Skin.  Annamarie Major, MD Dept of OB/GYN [phone]

## 2016-11-06 NOTE — ED Provider Notes (Signed)
I assumed care of the patient at 11 PM from Dr. Shaune Pollack MRI MRV of the brain revealed: Final result by Awilda Metro, MD (11/06/16 00:47:17)           Narrative:   CLINICAL DATA: Gradual onset headache for 2 days, now severe. Sinus pressure. Twenty-seven weeks pregnant.  EXAM: MRI HEAD WITHOUT CONTRAST  MRV HEAD WITHOUT CONTRAST  TECHNIQUE: Multiplanar, multiecho pulse sequences of the brain and surrounding structures were obtained without intravenous contrast. Angiographic images of the intracranial venous structures were obtained using MRV technique without intravenous contrast.  COMPARISON: CT HEAD November 26, 2010 and MRI of the head May 17, 2007  FINDINGS: MRI HEAD:  BRAIN: No reduced diffusion to suggest acute ischemia. No susceptibility artifact to suggest hemorrhage. The ventricles and sulci are normal for patient's age. No suspicious parenchymal signal, masses or mass effect. No abnormal extra-axial fluid collections. No extra-axial masses though, contrast enhanced sequences would be more sensitive.  VASCULAR: Normal major intracranial vascular flow voids present at skull base.  SKULL AND UPPER CERVICAL SPINE: No abnormal sellar expansion. No suspicious calvarial bone marrow signal. Craniocervical junction maintained.  SINUSES/ORBITS: Soft tissue effaces the RIGHT sphenoid sinus. Mild paranasal sinus mucosal thickening. The included ocular globes and orbital contents are non-suspicious.  OTHER: None.  MRV HEAD:  Normal flow related enhancement within the superior sagittal sinus, torcula of the Herophili, bilateral transverse, sigmoid sinuses and included internal jugular veins. Normal flow related enhancement of the internal cerebral veins.  IMPRESSION: Normal MRI head.  Severe sphenoid sinusitis.  Normal MRV head.   Electronically Signed By: Awilda Metro M.D. On: 11/06/2016 00:47            MR MRV HEAD WO CM (Final result)   Result time 11/06/16 00:47:17  Final result by Awilda Metro, MD (11/06/16 00:47:17)           Narrative:   CLINICAL DATA: Gradual onset headache for 2 days, now severe. Sinus pressure. Twenty-seven weeks pregnant.  EXAM: MRI HEAD WITHOUT CONTRAST  MRV HEAD WITHOUT CONTRAST  TECHNIQUE: Multiplanar, multiecho pulse sequences of the brain and surrounding structures were obtained without intravenous contrast. Angiographic images of the intracranial venous structures were obtained using MRV technique without intravenous contrast.  COMPARISON: CT HEAD November 26, 2010 and MRI of the head May 17, 2007  FINDINGS: MRI HEAD:  BRAIN: No reduced diffusion to suggest acute ischemia. No susceptibility artifact to suggest hemorrhage. The ventricles and sulci are normal for patient's age. No suspicious parenchymal signal, masses or mass effect. No abnormal extra-axial fluid collections. No extra-axial masses though, contrast enhanced sequences would be more sensitive.  VASCULAR: Normal major intracranial vascular flow voids present at skull base.  SKULL AND UPPER CERVICAL SPINE: No abnormal sellar expansion. No suspicious calvarial bone marrow signal. Craniocervical junction maintained.  SINUSES/ORBITS: Soft tissue effaces the RIGHT sphenoid sinus. Mild paranasal sinus mucosal thickening. The included ocular globes and orbital contents are non-suspicious.  OTHER: None.  MRV HEAD:  Normal flow related enhancement within the superior sagittal sinus, torcula of the Herophili, bilateral transverse, sigmoid sinuses and included internal jugular veins. Normal flow related enhancement of the internal cerebral veins.  IMPRESSION: Normal MRI head.  Severe sphenoid sinusitis.  Normal MRV head.   Electronically Signed By: Awilda Metro M.D. On: 11/06/2016 00:47           Patient admits to one half week history of congestion and nonproductive cough. Given  that history and MRI findings consistent with sinusitis as  the patient's headache secondary to be sinus headache.   Darci Currentandolph N Brown, MD 11/06/16 0130

## 2017-01-07 ENCOUNTER — Encounter: Payer: Self-pay | Admitting: *Deleted

## 2017-01-07 ENCOUNTER — Observation Stay
Admission: EM | Admit: 2017-01-07 | Discharge: 2017-01-07 | Disposition: A | Discharge status: Home / Self Care | Payer: Medicaid Other | Attending: Obstetrics & Gynecology | Admitting: Obstetrics & Gynecology

## 2017-01-07 DIAGNOSIS — Z3A35 35 weeks gestation of pregnancy: Secondary | ICD-10-CM | POA: Insufficient documentation

## 2017-01-07 DIAGNOSIS — O99333 Smoking (tobacco) complicating pregnancy, third trimester: Secondary | ICD-10-CM | POA: Insufficient documentation

## 2017-01-07 DIAGNOSIS — F17290 Nicotine dependence, other tobacco product, uncomplicated: Secondary | ICD-10-CM | POA: Diagnosis not present

## 2017-01-07 DIAGNOSIS — O471 False labor at or after 37 completed weeks of gestation: Secondary | ICD-10-CM | POA: Diagnosis present

## 2017-01-07 DIAGNOSIS — Z79891 Long term (current) use of opiate analgesic: Secondary | ICD-10-CM | POA: Insufficient documentation

## 2017-01-07 DIAGNOSIS — O368130 Decreased fetal movements, third trimester, not applicable or unspecified: Secondary | ICD-10-CM | POA: Insufficient documentation

## 2017-01-07 DIAGNOSIS — O4703 False labor before 37 completed weeks of gestation, third trimester: Secondary | ICD-10-CM | POA: Diagnosis not present

## 2017-01-07 DIAGNOSIS — O368190 Decreased fetal movements, unspecified trimester, not applicable or unspecified: Secondary | ICD-10-CM | POA: Diagnosis present

## 2017-01-07 MED ORDER — ACETAMINOPHEN 325 MG PO TABS: 650.0000 mg | ORAL_TABLET | ORAL | Status: DC | PRN

## 2017-01-07 MED ORDER — ONDANSETRON HCL 4 MG/2ML IJ SOLN: 4.0000 mg | Freq: Four times a day (QID) | INTRAMUSCULAR | Status: DC | PRN

## 2017-01-07 NOTE — Final Progress Note (Signed)
Physician Final Progress Note  Patient ID: Betty Powell MRN: [id number] DOB/AGE: 26/03/1991 25 y.o.  Admit date: 01/07/2017 Admitting provider: Nadara Mustardobert P Allin Frix, MD Discharge date: 01/07/2017   Admission Diagnoses: DFM and Mild ctxs  Discharge Diagnoses:  Active Problems:   Decreased fetal movement   False labor  Consults: None  Significant Findings/ Diagnostic Studies: Pt is a 26 yo G5P1031 at 35 weeks w DFM today and some mild ctxs; no recent trauma or infection or sex.  No VB or ROM.  [facility], on Subutex for addiction.  She has a past medical history of Anxiety. She  has a past surgical history that includes No past surgeries. The patient has a family history of no birth defects or gyn malignancies. She reports that she has been smoking E-cigarettes.  She has never used smokeless tobacco. She reports that she does not drink alcohol or use drugs.  Current Facility-Administered Medications:  .  acetaminophen (TYLENOL) tablet 650 mg, 650 mg, Oral, Q4H PRN, Nadara Mustardobert P Sable Knoles, MD .  ondansetron [facility](ZOFRAN) injection 4 mg, 4 mg, Intravenous, Q6H PRN, Nadara Mustardobert P Pearlee Arvizu, MD  Current Outpatient Prescriptions:  .  buprenorphine (SUBUTEX) 2 MG SUBL SL tablet, Place 2 mg under the tongue daily., Disp: , Rfl:  .  Prenatal Vit-Fe Fumarate-FA (PRENATAL MULTIVITAMIN) TABS tablet, Take 1 tablet by mouth daily at 12 noon., Disp: , Rfl:  .  HYDROcodone-acetaminophen (NORCO) 5-325 MG tablet, Take 1 tablet by mouth every 6 (six) hours as needed for moderate pain. (Patient not taking: Reported on 10/17/2016), Disp: 15 tablet, Rfl: 0  Patient has no known allergies. Review of Systems - Negative except as above.  Procedures: A NST procedure was performed with FHR monitoring and a normal baseline established, appropriate time of 20-40 minutes of evaluation, and accels >2 seen w 15x15 characteristics.  Results show a REACTIVE NST.   Discharge Condition: good  Disposition: 01-Home or Self Care  Diet:  Regular diet  Discharge Activity: Activity as tolerated   Allergies as of 01/07/2017   No Known Allergies     Medication List    TAKE these medications   buprenorphine 2 MG Subl SL tablet Commonly known as:  SUBUTEX Place 2 mg under the tongue daily.   HYDROcodone-acetaminophen 5-325 MG tablet Commonly known as:  NORCO Take 1 tablet by mouth every 6 (six) hours as needed for moderate pain.   prenatal multivitamin Tabs tablet Take 1 tablet by mouth daily at 12 noon.        Total time spent taking care of this patient: RN TRIAGE CALL  Signed: Letitia Libraobert Paul Sher Hellinger 01/07/2017, 8:01 PM

## 2017-01-07 NOTE — Plan of Care (Signed)
Discharge instructions, both oral and written, given to pt and SO.  Pt agrees with plan to go home. She states she wants to rest.  Pt is ready to leave dept in stable condition ambulatory.  Pt has next appt with MD tomorrow at 2 pm.  Ellison Carwin Montrae Braithwaite RNC

## 2017-01-07 NOTE — Discharge Instructions (Signed)
Take prescription for antibiotics prescribed for UTI last week as directed. Keep scheduled appointments.  If no improvements please call the provider's office for follow up.  If you have urgent concerns go to the nearest Emergency Dept. For assessment.

## 2017-01-07 NOTE — Discharge Summary (Signed)
  See FPN 

## 2017-01-08 ENCOUNTER — Ambulatory Visit (INDEPENDENT_AMBULATORY_CARE_PROVIDER_SITE_OTHER): Payer: Medicaid Other | Admitting: Certified Nurse Midwife

## 2017-01-08 VITALS — BP 98/58 | Wt 156.0 lb

## 2017-01-08 DIAGNOSIS — O2343 Unspecified infection of urinary tract in pregnancy, third trimester: Secondary | ICD-10-CM

## 2017-01-08 DIAGNOSIS — Z3A35 35 weeks gestation of pregnancy: Secondary | ICD-10-CM

## 2017-01-08 DIAGNOSIS — O2340 Unspecified infection of urinary tract in pregnancy, unspecified trimester: Secondary | ICD-10-CM | POA: Insufficient documentation

## 2017-01-08 DIAGNOSIS — F1910 Other psychoactive substance abuse, uncomplicated: Secondary | ICD-10-CM | POA: Insufficient documentation

## 2017-01-08 DIAGNOSIS — O99320 Drug use complicating pregnancy, unspecified trimester: Secondary | ICD-10-CM

## 2017-01-08 DIAGNOSIS — O0993 Supervision of high risk pregnancy, unspecified, third trimester: Secondary | ICD-10-CM

## 2017-01-08 MED ORDER — NITROFURANTOIN MONOHYD MACRO 100 MG PO CAPS: 100.0000 mg | ORAL_CAPSULE | Freq: Every day | ORAL | 0 refills | Status: DC

## 2017-01-08 NOTE — Progress Notes (Signed)
Pt went to L&D over the wknd. C/o intermittent ctx.

## 2017-01-08 NOTE — Progress Notes (Signed)
Just picked up RX for Macrobid yesterday for UTI (2/19 specimen). Seen in L&D yesterday for contractions-was a FT dilated. Baby active. Still on the 12 mgm Suboxone daily. No vulvar itching, but passed a pink mucous plug yesterday. Wants to breast feed. [facility]: Vulva: no lesions or inflammation, vagina: clear to white discharge Wet prep negative for Trich, clue cells or hyphae A: IUP at 35 weeks with recurrent UTIS (third one in pregnancy) No evidence of vaginitis P: ROB in 1 week. UDS today.  Macrobid qhs after completing course of Macrobid Continue H2O intake Consider cranberry juice or cranberry pills daily

## 2017-01-09 LAB — DRUG SCREEN, URINE
Amphetamines, Urine: NEGATIVE ng/mL
BARBITURATE SCREEN URINE: NEGATIVE ng/mL
BENZODIAZEPINE QUANT UR: NEGATIVE ng/mL
COCAINE (METAB.): NEGATIVE ng/mL
Cannabinoid Quant, Ur: NEGATIVE ng/mL
OPIATE QUANT UR: NEGATIVE ng/mL
PCP QUANT UR: NEGATIVE ng/mL

## 2017-01-16 ENCOUNTER — Encounter: Payer: Medicaid Other | Admitting: Obstetrics and Gynecology

## 2017-01-16 ENCOUNTER — Encounter: Payer: Medicaid Other | Admitting: Obstetrics & Gynecology

## 2017-01-19 ENCOUNTER — Ambulatory Visit (INDEPENDENT_AMBULATORY_CARE_PROVIDER_SITE_OTHER): Payer: Medicaid Other | Admitting: Obstetrics and Gynecology

## 2017-01-19 VITALS — BP 108/68 | Wt 158.0 lb

## 2017-01-19 DIAGNOSIS — O09293 Supervision of pregnancy with other poor reproductive or obstetric history, third trimester: Secondary | ICD-10-CM

## 2017-01-19 DIAGNOSIS — Z3A36 36 weeks gestation of pregnancy: Secondary | ICD-10-CM

## 2017-01-19 DIAGNOSIS — O99320 Drug use complicating pregnancy, unspecified trimester: Secondary | ICD-10-CM

## 2017-01-19 DIAGNOSIS — F1910 Other psychoactive substance abuse, uncomplicated: Secondary | ICD-10-CM

## 2017-01-19 DIAGNOSIS — O0993 Supervision of high risk pregnancy, unspecified, third trimester: Secondary | ICD-10-CM

## 2017-01-19 NOTE — Progress Notes (Signed)
GBS/Aptima today. No vb. No lof.  

## 2017-01-22 LAB — GC/CHLAMYDIA PROBE AMP
Chlamydia trachomatis, NAA: NEGATIVE
NEISSERIA GONORRHOEAE BY PCR: NEGATIVE

## 2017-01-22 LAB — STREP GP B NAA: Strep Gp B NAA: NEGATIVE

## 2017-01-23 DIAGNOSIS — O0993 Supervision of high risk pregnancy, unspecified, third trimester: Secondary | ICD-10-CM | POA: Insufficient documentation

## 2017-01-26 ENCOUNTER — Ambulatory Visit (INDEPENDENT_AMBULATORY_CARE_PROVIDER_SITE_OTHER): Payer: Medicaid Other | Admitting: Obstetrics and Gynecology

## 2017-01-26 VITALS — BP 112/70 | Wt 165.0 lb

## 2017-01-26 DIAGNOSIS — F1910 Other psychoactive substance abuse, uncomplicated: Secondary | ICD-10-CM

## 2017-01-26 DIAGNOSIS — Z3A38 38 weeks gestation of pregnancy: Secondary | ICD-10-CM

## 2017-01-26 DIAGNOSIS — O2343 Unspecified infection of urinary tract in pregnancy, third trimester: Secondary | ICD-10-CM

## 2017-01-26 DIAGNOSIS — O99320 Drug use complicating pregnancy, unspecified trimester: Secondary | ICD-10-CM

## 2017-01-26 DIAGNOSIS — O0993 Supervision of high risk pregnancy, unspecified, third trimester: Secondary | ICD-10-CM

## 2017-01-26 NOTE — Progress Notes (Signed)
No vb. No lof. irreg ctx. Hasn't been taking uti suppression meds. Found rx today and states she will start taking. No sx today.

## 2017-01-31 ENCOUNTER — Encounter: Payer: Self-pay | Admitting: *Deleted

## 2017-01-31 ENCOUNTER — Observation Stay
Admission: EM | Admit: 2017-01-31 | Discharge: 2017-01-31 | Disposition: A | Discharge status: Home / Self Care | Payer: Medicaid Other | Attending: Obstetrics and Gynecology | Admitting: Obstetrics and Gynecology

## 2017-01-31 DIAGNOSIS — O471 False labor at or after 37 completed weeks of gestation: Secondary | ICD-10-CM | POA: Diagnosis not present

## 2017-01-31 DIAGNOSIS — O0993 Supervision of high risk pregnancy, unspecified, third trimester: Secondary | ICD-10-CM

## 2017-01-31 DIAGNOSIS — B373 Candidiasis of vulva and vagina: Secondary | ICD-10-CM

## 2017-01-31 DIAGNOSIS — B3749 Other urogenital candidiasis: Secondary | ICD-10-CM | POA: Diagnosis not present

## 2017-01-31 DIAGNOSIS — B3731 Acute candidiasis of vulva and vagina: Secondary | ICD-10-CM

## 2017-01-31 DIAGNOSIS — O98813 Other maternal infectious and parasitic diseases complicating pregnancy, third trimester: Principal | ICD-10-CM | POA: Insufficient documentation

## 2017-01-31 DIAGNOSIS — O4292 Full-term premature rupture of membranes, unspecified as to length of time between rupture and onset of labor: Secondary | ICD-10-CM | POA: Diagnosis present

## 2017-01-31 DIAGNOSIS — Z3A38 38 weeks gestation of pregnancy: Secondary | ICD-10-CM | POA: Insufficient documentation

## 2017-01-31 HISTORY — DX: Unspecified convulsions: R56.9

## 2017-01-31 LAB — WET PREP, GENITAL
Clue Cells Wet Prep HPF POC: NONE SEEN
Sperm: NONE SEEN
Trich, Wet Prep: NONE SEEN

## 2017-01-31 MED ORDER — FLUCONAZOLE 150 MG PO TABS: 150.0000 mg | ORAL_TABLET | Freq: Once | ORAL | 0 refills | Status: AC

## 2017-01-31 NOTE — Discharge Summary (Signed)
See Final Progress note 

## 2017-01-31 NOTE — Discharge Summary (Signed)
Pt d/c'd home with mother in stable condition. MD confirmed membranes were not ruptured, but pt did have a yeast infection. Follow up appointment made. Prescriptions called in. Pt educated on labor precautions and antibiotic stewardship. Verbalized understanding.

## 2017-01-31 NOTE — Final Progress Note (Signed)
Physician Final Progress Note  Patient ID: SHAMIEKA GULLO MRN: [id number] DOB/AGE: [birth year] 26 y.o.  Admit date: 01/31/2017 Admitting provider: Conard Novak, MD Discharge date: 01/31/2017   Admission Diagnoses:  1) intrauterine pregnancy at [redacted]w[redacted]d  2) suspected rupture of membranes  Discharge Diagnoses:  1) intrauterine pregnancy at [redacted]w[redacted]d  2) no evidence of rupture of membranes   History of Present Illness: The patient is a 26 y.o. female G5P1031 at [redacted]w[redacted]d who presents for evaluation of rupture of membranes. She was doing some "pregnancy squats" at 230 this morning when she felt an odd pressure. She tried to make it to the toilet. However, she had a gush of clear, light-yellow fluid prior to making it to the toilet.  There was no associated blood.  She has had scant discharge-like fluid since.  Notes +FM. She was having contractions that were strong this morning, have eased up now.  Pregnancy complicated by recurrent UTIs, suboxone use due to h/o opioid abuse.   Past Medical History:  Diagnosis Date  . Anxiety   . Seizures (HCC)     Past Surgical History:  Procedure Laterality Date  . NO PAST SURGERIES      No current facility-administered medications on file prior to encounter.    Current Outpatient Prescriptions on File Prior to Encounter  Medication Sig Dispense Refill  . buprenorphine (SUBUTEX) 8 MG SUBL SL tablet DISSOLVE 1 T UNDER THE TONGUE BID FOR 7 DAYS  0  . Prenatal Vit-Fe Fumarate-FA (PRENATAL MULTIVITAMIN) TABS tablet Take 1 tablet by mouth daily at 12 noon.    . nitrofurantoin, macrocrystal-monohydrate, (MACROBID) 100 MG capsule Take 1 capsule (100 mg total) by mouth at bedtime. Start after completing 7 day course of Macrobid BID (Patient not taking: Reported on 01/26/2017) 30 capsule 0   Allergies: No Known Allergies  Social History   Social History  . Marital status: Married    Spouse name: N/A  . Number of children: N/A  . Years of education: N/A    Occupational History  . Not on file.   Social History Main Topics  . Smoking status: Current Every Day Smoker    Packs/day: 0.50    Types: E-cigarettes  . Smokeless tobacco: Never Used  . Alcohol use No  . Drug use: No  . Sexual activity: Yes    Birth control/ protection: Injection   Other Topics Concern  . Not on file   Social History Narrative  . No narrative on file    Physical Exam: BP (!) 102/57 (BP Location: Left Arm)   Pulse 77   Temp 98.3 F (36.8 C) (Oral)   Resp 20   Wt 165 lb (74.8 kg)   LMP 04/20/2016   BMI 28.32 kg/m   Gen: NAD CV: RRR Pulm: moves air well Pelvic: NEFG, thick white discharge, no pooling. Negative nitrazine, negative ferning. Ext: no e/c/t  Consults: None  Significant Findings/ Diagnostic Studies:  Lab Results  Component Value Date   TRICHWETPREP NONE SEEN 01/31/2017   CLUECELLS NONE SEEN 01/31/2017   WBCWETPREP RARE (A) 01/31/2017   YEASTWETPREP PRESENT (A) 01/31/2017     Procedures: NST Baseline FHR: 130 beats/min Variability: moderate Accelerations: present Decelerations: absent Tocometry: irritability, occasional contractions ~2 q 10 min   Discharge Condition: stable  Disposition: 01-Home or Self Care  Diet: Regular diet  Discharge Activity: Activity as tolerated   Allergies as of 01/31/2017   No Known Allergies     Medication List    TAKE  these medications   buprenorphine 8 MG Subl SL tablet Commonly known as:  SUBUTEX DISSOLVE 1 T UNDER THE TONGUE BID FOR 7 DAYS   fluconazole 150 MG tablet Commonly known as:  DIFLUCAN Take 1 tablet (150 mg total) by mouth once.   nitrofurantoin (macrocrystal-monohydrate) 100 MG capsule Commonly known as:  MACROBID Take 1 capsule (100 mg total) by mouth at bedtime. Start after completing 7 day course of Macrobid BID   prenatal multivitamin Tabs tablet Take 1 tablet by mouth daily at 12 noon.      Follow-up Information    [facility] Follow [facility].    Why:  Keep Previously schedule routine prenatal appointment Contact information: [address] [place] [phone]          Total time spent taking care of this patient: 20 minutes  Signed: Thomasene MohairStephen Maurisha Mongeau, MD  01/31/2017, 10:30 AM

## 2017-02-01 ENCOUNTER — Ambulatory Visit (INDEPENDENT_AMBULATORY_CARE_PROVIDER_SITE_OTHER): Payer: Medicaid Other | Admitting: Advanced Practice Midwife

## 2017-02-01 VITALS — BP 118/60 | Wt 166.0 lb

## 2017-02-01 DIAGNOSIS — Z3A38 38 weeks gestation of pregnancy: Secondary | ICD-10-CM

## 2017-02-01 NOTE — Progress Notes (Signed)
[facility], has UTI

## 2017-02-01 NOTE — Patient Instructions (Addendum)
Vaginal Yeast infection, Adult Vaginal yeast infection is a condition that causes soreness, swelling, and redness (inflammation) of the vagina. It also causes vaginal discharge. This is a common condition. Some women get this infection frequently. What are the causes? This condition is caused by a change in the normal balance of the yeast (candida) and bacteria that live in the vagina. This change causes an overgrowth of yeast, which causes the inflammation. What increases the risk? This condition is more likely to develop in:  Women who take antibiotic medicines.  Women who have diabetes.  Women who take birth control pills.  Women who are pregnant.  Women who douche often.  Women who have a weak defense (immune) system.  Women who have been taking steroid medicines for a long time.  Women who frequently wear tight clothing. What are the signs or symptoms? Symptoms of this condition include:  White, thick vaginal discharge.  Swelling, itching, redness, and irritation of the vagina. The lips of the vagina (vulva) may be affected as well.  Pain or a burning feeling while urinating.  Pain during sex. How is this diagnosed? This condition is diagnosed with a medical history and physical exam. This will include a pelvic exam. Your health care provider will examine a sample of your vaginal discharge under a microscope. Your health care provider may send this sample for testing to confirm the diagnosis. How is this treated? This condition is treated with medicine. Medicines may be over-the-counter or prescription. You may be told to use one or more of the following:  Medicine that is taken orally.  Medicine that is applied as a cream.  Medicine that is inserted directly into the vagina (suppository). Follow these instructions at home:  Take or apply over-the-counter and prescription medicines only as told by your health care provider.  Do not have sex until your health care  provider has approved. Tell your sex partner that you have a yeast infection. That person should go to his or her health care provider if he or she develops symptoms.  Do not wear tight clothes, such as pantyhose or tight pants.  Avoid using tampons until your health care provider approves.  Eat more yogurt. This may help to keep your yeast infection from returning.  Try taking a sitz bath to help with discomfort. This is a warm water bath that is taken while you are sitting down. The water should only come up to your hips and should cover your buttocks. Do this 3-4 times per day or as told by your health care provider.  Do not douche.  Wear breathable, cotton underwear.  If you have diabetes, keep your blood sugar levels under control. Contact a health care provider if:  You have a fever.  Your symptoms go away and then return.  Your symptoms do not get better with treatment.  Your symptoms get worse.  You have new symptoms.  You develop blisters in or around your vagina.  You have blood coming from your vagina and it is not your menstrual period.  You develop pain in your abdomen. This information is not intended to replace advice given to you by your health care provider. Make sure you discuss any questions you have with your health care provider. Document Released: 08/02/2005 Document Revised: 04/05/2016 Document Reviewed: 04/26/2015 Elsevier Interactive Patient Education  2017 [employer] Inc[employer]  Pregnancy and Urinary Tract Infection What is a urinary tract infection? A urinary tract infection (UTI) is an infection of any part of  the urinary tract. This includes the kidneys, the tubes that connect your kidneys to your bladder (ureters), the bladder, and the tube that carries urine out of your body (urethra). These organs make, store, and get rid of urine in the body. A UTI can be a bladder infection (cystitis) or a kidney infection (pyelonephritis). This infection may be caused  by fungi, viruses, and bacteria. Bacteria are the most common cause of UTIs. You are more likely to develop a UTI during pregnancy because:  The physical and hormonal changes your body goes through can make it easier for bacteria to get into your urinary tract.  Your growing baby puts pressure on your uterus and can affect urine flow. Does a UTI place my baby at risk? An untreated UTI during pregnancy could lead to a kidney infection, which can cause health problems that could affect your baby. Possible complications of an untreated UTI include:  Having your baby before 37 weeks of pregnancy (premature).  Having a baby with a low birth weight.  Developing high blood pressure during pregnancy (preeclampsia). What are the symptoms of a UTI? Symptoms of a UTI include:  Fever.  Frequent urination or passing small amounts of urine frequently.  Needing to urinate urgently.  Pain or a burning sensation with urination.  Urine that smells bad or unusual.  Cloudy urine.  Pain in the lower abdomen or back.  Trouble urinating.  Blood in the urine.  Vomiting or being less hungry than normal.  Diarrhea or abdominal pain.  Vaginal discharge. What are the treatment options for a UTI during pregnancy? Treatment for this condition may include:  Antibiotic medicines that are safe to take during pregnancy.  Other medicines to treat less common causes of UTI. How can I prevent a UTI?   To prevent a UTI:  Go to the bathroom as soon as you feel the need.  Always wipe from front to back.  Wash your genital area with soap and warm water daily.  Empty your bladder before and after sex.  Wear cotton underwear.  Limit your intake of high sugar foods or drinks, such as regular soda, juice, and sweets.  Drink 6-8 glasses of water daily.  Do not wear tight-fitting pants.  Do not douche or use deodorant sprays.  Do not drink alcohol, caffeine, or carbonated drinks. These can  irritate the bladder. Contact a health care provider if:  Your symptoms do not improve or get worse.  You have a fever after two days of treatment.  You have a rash.  You have abnormal vaginal discharge.  You have back or side pain.  You have chills.  You have nausea and vomiting. Get help right away if: Seek immediate medical care if you are pregnant and:  You feel contractions in your uterus.  You have lower belly pain.  You have a gush of fluid from your vagina.  You have blood in your urine.  You are vomiting and cannot keep down any medicines or water. This information is not intended to replace advice given to you by your health care provider. Make sure you discuss any questions you have with your health care provider. Document Released: 02/17/2011 Document Revised: 10/06/2016 Document Reviewed: 09/13/2015 Elsevier Interactive Patient Education  2017 [employer]

## 2017-02-01 NOTE — Care Management (Signed)
Pt. Receiving OB Care management services with Betty Powell BA OBCM.  Tel# [phone].

## 2017-02-01 NOTE — Progress Notes (Signed)
Was seen yesterday and has UTI and yeast infection. Says she will pick up meds today. Pt hoping to labor without epidural- discussed coping methods without epidural. Recommended soaking in tub with sea salt for vulvar irritation. Reviewed labor precautions.

## 2017-02-03 ENCOUNTER — Observation Stay
Admission: EM | Admit: 2017-02-03 | Discharge: 2017-02-03 | Disposition: A | Discharge status: Home / Self Care | Payer: Medicaid Other | Attending: Obstetrics & Gynecology | Admitting: Obstetrics & Gynecology

## 2017-02-03 DIAGNOSIS — Z3A39 39 weeks gestation of pregnancy: Secondary | ICD-10-CM

## 2017-02-03 DIAGNOSIS — R109 Unspecified abdominal pain: Secondary | ICD-10-CM | POA: Insufficient documentation

## 2017-02-03 DIAGNOSIS — O26893 Other specified pregnancy related conditions, third trimester: Principal | ICD-10-CM | POA: Insufficient documentation

## 2017-02-03 LAB — URINALYSIS, COMPLETE (UACMP) WITH MICROSCOPIC
BILIRUBIN URINE: NEGATIVE
Bacteria, UA: NONE SEEN
GLUCOSE, UA: NEGATIVE mg/dL
Hgb urine dipstick: NEGATIVE
Ketones, ur: NEGATIVE mg/dL
LEUKOCYTES UA: NEGATIVE
NITRITE: NEGATIVE
PH: 6 (ref 5.0–8.0)
Protein, ur: NEGATIVE mg/dL
SPECIFIC GRAVITY, URINE: 1.017 (ref 1.005–1.030)

## 2017-02-03 LAB — ROM PLUS (ARMC ONLY): ROM PLUS: NEGATIVE

## 2017-02-03 LAB — POCT NITRAZINE TEST: POCT NITRAZINE (AMNIOSURE): NEGATIVE

## 2017-02-03 NOTE — Discharge Summary (Signed)
seeFPN  

## 2017-02-03 NOTE — Final Progress Note (Signed)
Physician Final Progress Note  Patient ID: Betty Powell MRN: [id number] DOB/AGE: [birth year] 26 y.o.  Admit date: 02/03/2017 Admitting provider: Nadara Mustard, MD Discharge date: 02/03/2017   Admission Diagnoses: Abdominal Pain affecting pregnancy, Leakage of fluid  Discharge Diagnoses:  Active Problems:   Labor and delivery indication for care or intervention    Consults: None  Significant Findings/ Diagnostic Studies: [name] at 39 weeks with pain and leakage of fluid tonight; routine PNC; no complications.  Good FM. Pt found to have min-no ctxs; neg Nitrazine and ROM test, and Fingertip cervical exam.  Plan for outpt mgt.  Procedures: A NST procedure was performed with FHR monitoring and a normal baseline established, appropriate time of 20-40 minutes of evaluation, and accels >2 seen w 15x15 characteristics.  Results show a REACTIVE NST.   Discharge Condition: good  Disposition: 01-Home or Self Care  Diet: Regular diet  Discharge Activity: Activity as tolerated   Allergies as of 02/03/2017   No Known Allergies     Medication List    ASK your doctor about these medications   buprenorphine 8 MG Subl SL tablet Commonly known as:  SUBUTEX DISSOLVE 1 T UNDER THE TONGUE BID FOR 7 DAYS   nitrofurantoin (macrocrystal-monohydrate) 100 MG capsule Commonly known as:  MACROBID Take 1 capsule (100 mg total) by mouth at bedtime. Start after completing 7 day course of Macrobid BID   prenatal multivitamin Tabs tablet Take 1 tablet by mouth daily at 12 noon.       Patient presented for evaluation of labor.  Patient had cervical exam by RN and this was reported to me. I reviewed her vital signs and fetal tracing, both of which were reassuring.  Patient was discharge as she was not laboring.  Total time spent taking care of this patient: nurse triage  Signed: Letitia Libra 02/03/2017, 8:22 AM

## 2017-02-07 ENCOUNTER — Telehealth: Payer: Self-pay

## 2017-02-07 NOTE — Telephone Encounter (Signed)
Pt calling stating she has appt Fri. And has concerns of hurting real bad when the baby moves it feels dry, ctx q45min, really sore, no leaking of fluid.  Per SDJ, pt aware to go to L&D.  Latisha at L&D notified.

## 2017-02-08 ENCOUNTER — Observation Stay
Admission: EM | Admit: 2017-02-08 | Discharge: 2017-02-08 | Disposition: A | Discharge status: Home / Self Care | Payer: Medicaid Other | Attending: Obstetrics and Gynecology | Admitting: Obstetrics and Gynecology

## 2017-02-08 DIAGNOSIS — O0993 Supervision of high risk pregnancy, unspecified, third trimester: Secondary | ICD-10-CM

## 2017-02-08 DIAGNOSIS — Z3A39 39 weeks gestation of pregnancy: Secondary | ICD-10-CM | POA: Diagnosis not present

## 2017-02-08 DIAGNOSIS — Z0379 Encounter for other suspected maternal and fetal conditions ruled out: Secondary | ICD-10-CM

## 2017-02-08 DIAGNOSIS — Z79899 Other long term (current) drug therapy: Secondary | ICD-10-CM | POA: Diagnosis not present

## 2017-02-08 DIAGNOSIS — O4292 Full-term premature rupture of membranes, unspecified as to length of time between rupture and onset of labor: Secondary | ICD-10-CM | POA: Diagnosis present

## 2017-02-08 DIAGNOSIS — F17290 Nicotine dependence, other tobacco product, uncomplicated: Secondary | ICD-10-CM | POA: Diagnosis not present

## 2017-02-08 DIAGNOSIS — Z3A29 29 weeks gestation of pregnancy: Secondary | ICD-10-CM | POA: Diagnosis not present

## 2017-02-08 DIAGNOSIS — O99333 Smoking (tobacco) complicating pregnancy, third trimester: Secondary | ICD-10-CM | POA: Insufficient documentation

## 2017-02-08 DIAGNOSIS — O418X30 Other specified disorders of amniotic fluid and membranes, third trimester, not applicable or unspecified: Secondary | ICD-10-CM | POA: Diagnosis not present

## 2017-02-08 NOTE — Progress Notes (Signed)
Pt discharged home, has appointment for tomorrow, is visibly upset that our tests (nitrazine, pooling and fern) were all negative. Patient reassured that fetal testing was reactive, that if she had any continued leaking to come in to be re-evaluated.

## 2017-02-08 NOTE — OB Triage Note (Addendum)
Ms. Skora here with c/o LOF, reports a gush of clear fluid 2300 on 02/07/17, another small gush in the morning. Denies ctx, bleeding, reports positive fetal movement.  Pt reports eating lunch recently.   Pt presents baggie with wet washrag that she states is from her gush last night, along with 2 small pieces of nitrazine paper that she states "a nurse gave me so I wouldn't have a false alarm and they had turned blue" Patient's small pieces of nitrazine paper were visibly dry, slightly green-tinged. Advised pt that there is no way to verify that the paper was stored properly, that no other material caused the color change and that we would assess her here.   CNM Gledhill entered room during triage and given report on pt complaint.

## 2017-02-08 NOTE — Discharge Summary (Signed)
Physician Final Progress Note  Patient ID: Betty Powell MRN: [id number] DOB/AGE: 1991-03-01 26 y.o.  Admit date: 02/08/2017 Admitting provider: Tresea Mall, CNM Discharge date: 02/08/2017   Admission Diagnoses: fluid leaking  Discharge Diagnoses:  Active Problems:   * No active hospital problems. * IUP at [redacted]w[redacted]d with reactive NST, membranes intact, not in labor  History of Present Illness: The patient is a 26 y.o. female [phone] at [redacted]w[redacted]d who presents for report of a "gush" around a teaspoon of fluid last night at 11 pm and later another gush that was larger. She admits positive fetal movement. She admits braxton hicks ctxs. She denies vaginal bleeding. Pt admitted for observation, placed on monitors and tests for amniotic fluid done.   Past Medical History:  Diagnosis Date  . Anxiety   . Seizures (HCC)     Past Surgical History:  Procedure Laterality Date  . NO PAST SURGERIES      No current facility-administered medications on file prior to encounter.    Current Outpatient Prescriptions on File Prior to Encounter  Medication Sig Dispense Refill  . buprenorphine (SUBUTEX) 8 MG SUBL SL tablet DISSOLVE 1 T UNDER THE TONGUE BID FOR 7 DAYS  0  . nitrofurantoin, macrocrystal-monohydrate, (MACROBID) 100 MG capsule Take 1 capsule (100 mg total) by mouth at bedtime. Start after completing 7 day course of Macrobid BID 30 capsule 0  . Prenatal Vit-Fe Fumarate-FA (PRENATAL MULTIVITAMIN) TABS tablet Take 1 tablet by mouth daily at 12 noon.      No Known Allergies  Social History   Social History  . Marital status: Married    Spouse name: N/A  . Number of children: N/A  . Years of education: N/A   Occupational History  . Not on file.   Social History Main Topics  . Smoking status: Current Every Day Smoker    Packs/day: 0.50    Types: E-cigarettes  . Smokeless tobacco: Never Used  . Alcohol use No  . Drug use: No  . Sexual activity: Yes    Birth control/ protection:  Injection   Other Topics Concern  . Not on file   Social History Narrative  . No narrative on file    Physical Exam: BP 120/72 (BP Location: Right Arm)   Pulse 88   Temp 97.9 F (36.6 C) (Oral)   Resp 18   Ht  (1.626 m)   Wt 168 lb (76.2 kg)   LMP 04/20/2016   BMI 28.84 kg/m   Gen: NAD CV: RRR Pulm: CTAB Pelvic: deferred, visually closed on speculum exam Toco: occasional palpates mild Fetal Well Being: 120 bpm, moderate variability, +accelerations, -decelerations Membranes: Nitrazine negative, pooling negative, ferning negative Ext: trace edema, no evidence of DVT  Consults: None  Significant Findings/ Diagnostic Studies: see above for membrane status  Procedures: NST  Discharge Condition: good  Disposition: 01-Home or Self Care  Diet: Regular diet  Discharge Activity: Activity as tolerated  Discharge Instructions    Discharge activity:  No Restrictions    Complete by:  As directed    Discharge diet:  No restrictions    Complete by:  As directed    Fetal Kick Count:  Lie on our left side for one hour after a meal, and count the number of times your baby kicks.  If it is less than 5 times, get up, move around and drink some juice.  Repeat the test 30 minutes later.  If it is still less than 5 kicks in  an hour, notify your doctor.    Complete by:  As directed    LABOR:  When conractions begin, you should start to time them from the beginning of one contraction to the beginning  of the next.  When contractions are 5 - 10 minutes apart or less and have been regular for at least an hour, you should call your health care provider.    Complete by:  As directed    No sexual activity restrictions    Complete by:  As directed    Notify physician for bleeding from the vagina    Complete by:  As directed    Notify physician for blurring of vision or spots before the eyes    Complete by:  As directed    Notify physician for chills or fever    Complete by:  As  directed    Notify physician for fainting spells, "black outs" or loss of consciousness    Complete by:  As directed    Notify physician for increase in vaginal discharge    Complete by:  As directed    Notify physician for leaking of fluid    Complete by:  As directed    Notify physician for pain or burning when urinating    Complete by:  As directed    Notify physician for pelvic pressure (sudden increase)    Complete by:  As directed    Notify physician for severe or continued nausea or vomiting    Complete by:  As directed    Notify physician for sudden gushing of fluid from the vagina (with or without continued leaking)    Complete by:  As directed    Notify physician for sudden, constant, or occasional abdominal pain    Complete by:  As directed    Notify physician if baby moving less than usual    Complete by:  As directed      Allergies as of 02/08/2017   No Known Allergies     Medication List    TAKE these medications   buprenorphine 8 MG Subl SL tablet Commonly known as:  SUBUTEX DISSOLVE 1 T UNDER THE TONGUE BID FOR 7 DAYS   nitrofurantoin (macrocrystal-monohydrate) 100 MG capsule Commonly known as:  MACROBID Take 1 capsule (100 mg total) by mouth at bedtime. Start after completing 7 day course of Macrobid BID   prenatal multivitamin Tabs tablet Take 1 tablet by mouth daily at 12 noon.      Follow-up Information    [facility] Follow up.   Why:  go to regular scheduled prenatal appointment Contact information: [address] [place] [address] [phone]          Total time spent taking care of this patient: 15 minutes  Signed: Tresea Mall, CNM  02/08/2017, 1:19 PM

## 2017-02-09 ENCOUNTER — Ambulatory Visit (INDEPENDENT_AMBULATORY_CARE_PROVIDER_SITE_OTHER): Payer: Medicaid Other | Admitting: Certified Nurse Midwife

## 2017-02-09 VITALS — BP 100/60 | Wt 163.0 lb

## 2017-02-09 DIAGNOSIS — Z3A39 39 weeks gestation of pregnancy: Secondary | ICD-10-CM

## 2017-02-09 DIAGNOSIS — O99320 Drug use complicating pregnancy, unspecified trimester: Secondary | ICD-10-CM

## 2017-02-09 DIAGNOSIS — O0993 Supervision of high risk pregnancy, unspecified, third trimester: Secondary | ICD-10-CM

## 2017-02-09 NOTE — Progress Notes (Signed)
Pt went to L&D yesterday due to feeling gush of fluid. Also felt like a small gush of fluid leaked today.

## 2017-02-16 ENCOUNTER — Inpatient Hospital Stay
Admission: EM | Admit: 2017-02-16 | Discharge: 2017-02-19 | DRG: 775 | Disposition: A | Discharge status: Home / Self Care | Payer: Medicaid Other | Attending: Obstetrics and Gynecology | Admitting: Obstetrics and Gynecology

## 2017-02-16 DIAGNOSIS — F1120 Opioid dependence, uncomplicated: Secondary | ICD-10-CM | POA: Diagnosis present

## 2017-02-16 DIAGNOSIS — F1910 Other psychoactive substance abuse, uncomplicated: Secondary | ICD-10-CM | POA: Diagnosis present

## 2017-02-16 DIAGNOSIS — O99334 Smoking (tobacco) complicating childbirth: Secondary | ICD-10-CM | POA: Diagnosis present

## 2017-02-16 DIAGNOSIS — Z8249 Family history of ischemic heart disease and other diseases of the circulatory system: Secondary | ICD-10-CM

## 2017-02-16 DIAGNOSIS — Z833 Family history of diabetes mellitus: Secondary | ICD-10-CM

## 2017-02-16 DIAGNOSIS — O480 Post-term pregnancy: Principal | ICD-10-CM | POA: Diagnosis present

## 2017-02-16 DIAGNOSIS — F17290 Nicotine dependence, other tobacco product, uncomplicated: Secondary | ICD-10-CM | POA: Diagnosis present

## 2017-02-16 DIAGNOSIS — Z3A41 41 weeks gestation of pregnancy: Secondary | ICD-10-CM

## 2017-02-16 DIAGNOSIS — O99324 Drug use complicating childbirth: Secondary | ICD-10-CM | POA: Diagnosis present

## 2017-02-17 ENCOUNTER — Encounter: Payer: Self-pay | Admitting: Obstetrics & Gynecology

## 2017-02-17 DIAGNOSIS — O99324 Drug use complicating childbirth: Secondary | ICD-10-CM | POA: Diagnosis present

## 2017-02-17 DIAGNOSIS — F1120 Opioid dependence, uncomplicated: Secondary | ICD-10-CM | POA: Diagnosis present

## 2017-02-17 DIAGNOSIS — O480 Post-term pregnancy: Secondary | ICD-10-CM | POA: Diagnosis not present

## 2017-02-17 DIAGNOSIS — O99334 Smoking (tobacco) complicating childbirth: Secondary | ICD-10-CM | POA: Diagnosis present

## 2017-02-17 DIAGNOSIS — F17290 Nicotine dependence, other tobacco product, uncomplicated: Secondary | ICD-10-CM | POA: Diagnosis present

## 2017-02-17 DIAGNOSIS — Z3A41 41 weeks gestation of pregnancy: Secondary | ICD-10-CM

## 2017-02-17 DIAGNOSIS — Z8249 Family history of ischemic heart disease and other diseases of the circulatory system: Secondary | ICD-10-CM | POA: Diagnosis not present

## 2017-02-17 DIAGNOSIS — Z833 Family history of diabetes mellitus: Secondary | ICD-10-CM | POA: Diagnosis not present

## 2017-02-17 LAB — URINE DRUG SCREEN, QUALITATIVE (ARMC ONLY)
Amphetamines, Ur Screen: NOT DETECTED
Barbiturates, Ur Screen: NOT DETECTED
Benzodiazepine, Ur Scrn: NOT DETECTED
CANNABINOID 50 NG, UR ~~LOC~~: NOT DETECTED
Cocaine Metabolite,Ur ~~LOC~~: NOT DETECTED
MDMA (ECSTASY) UR SCREEN: NOT DETECTED
Methadone Scn, Ur: NOT DETECTED
OPIATE, UR SCREEN: NOT DETECTED
Phencyclidine (PCP) Ur S: NOT DETECTED
Tricyclic, Ur Screen: NOT DETECTED

## 2017-02-17 LAB — CBC
HCT: 32.6 % — ABNORMAL LOW (ref 35.0–47.0)
Hemoglobin: 11.5 g/dL — ABNORMAL LOW (ref 12.0–16.0)
MCH: 34.2 pg — ABNORMAL HIGH (ref 26.0–34.0)
MCHC: 35.4 g/dL (ref 32.0–36.0)
MCV: 96.6 fL (ref 80.0–100.0)
PLATELETS: 133 10*3/uL — AB (ref 150–440)
RBC: 3.37 MIL/uL — ABNORMAL LOW (ref 3.80–5.20)
RDW: 13 % (ref 11.5–14.5)
WBC: 7.4 10*3/uL (ref 3.6–11.0)

## 2017-02-17 LAB — TYPE AND SCREEN
ABO/RH(D): O POS
ANTIBODY SCREEN: NEGATIVE

## 2017-02-17 MED ORDER — DIPHENHYDRAMINE HCL 25 MG PO CAPS: 25.0000 mg | ORAL_CAPSULE | Freq: Four times a day (QID) | ORAL | Status: DC | PRN

## 2017-02-17 MED ORDER — SENNOSIDES-DOCUSATE SODIUM 8.6-50 MG PO TABS
2.0000 | ORAL_TABLET | ORAL | Status: DC
  Administered 2017-02-17 – 2017-02-18 (×2): 2 via ORAL
  Filled 2017-02-17 (×2): qty 2

## 2017-02-17 MED ORDER — LIDOCAINE HCL (PF) 1 % IJ SOLN
INTRAMUSCULAR | Status: AC
  Filled 2017-02-17: qty 30

## 2017-02-17 MED ORDER — ONDANSETRON HCL 4 MG PO TABS: 4.0000 mg | ORAL_TABLET | ORAL | Status: DC | PRN

## 2017-02-17 MED ORDER — DIBUCAINE 1 % RE OINT: 1.0000 "application " | TOPICAL_OINTMENT | RECTAL | Status: DC | PRN

## 2017-02-17 MED ORDER — OXYTOCIN 10 UNIT/ML IJ SOLN
INTRAMUSCULAR | Status: AC
  Filled 2017-02-17: qty 2

## 2017-02-17 MED ORDER — AMMONIA AROMATIC IN INHA
RESPIRATORY_TRACT | Status: AC
  Filled 2017-02-17: qty 10

## 2017-02-17 MED ORDER — LACTATED RINGERS IV SOLN
INTRAVENOUS | Status: DC
  Administered 2017-02-17: 04:00:00 via INTRAVENOUS

## 2017-02-17 MED ORDER — BUPRENORPHINE HCL 8 MG SL SUBL
8.0000 mg | SUBLINGUAL_TABLET | Freq: Every day | SUBLINGUAL | Status: DC
  Administered 2017-02-17: 4 mg via SUBLINGUAL
  Filled 2017-02-17: qty 1

## 2017-02-17 MED ORDER — IBUPROFEN 600 MG PO TABS
ORAL_TABLET | ORAL | Status: AC
  Administered 2017-02-17: 600 mg via ORAL
  Filled 2017-02-17: qty 1

## 2017-02-17 MED ORDER — WITCH HAZEL-GLYCERIN EX PADS: 1.0000 "application " | MEDICATED_PAD | CUTANEOUS | Status: DC | PRN

## 2017-02-17 MED ORDER — BUPRENORPHINE HCL 2 MG SL SUBL
4.0000 mg | SUBLINGUAL_TABLET | Freq: Two times a day (BID) | SUBLINGUAL | Status: DC
  Administered 2017-02-18 – 2017-02-19 (×4): 4 mg via SUBLINGUAL
  Filled 2017-02-17 (×4): qty 2

## 2017-02-17 MED ORDER — TERBUTALINE SULFATE 1 MG/ML IJ SOLN: 0.2500 mg | Freq: Once | INTRAMUSCULAR | Status: DC | PRN

## 2017-02-17 MED ORDER — COCONUT OIL OIL
1.0000 "application " | TOPICAL_OIL | Status: DC | PRN
  Administered 2017-02-18: 1 via TOPICAL
  Filled 2017-02-17: qty 120

## 2017-02-17 MED ORDER — LACTATED RINGERS IV SOLN: 500.0000 mL | INTRAVENOUS | Status: DC | PRN

## 2017-02-17 MED ORDER — ZOLPIDEM TARTRATE 5 MG PO TABS: 5.0000 mg | ORAL_TABLET | Freq: Every evening | ORAL | Status: DC | PRN

## 2017-02-17 MED ORDER — LIDOCAINE HCL (PF) 1 % IJ SOLN: 30.0000 mL | INTRAMUSCULAR | Status: DC | PRN

## 2017-02-17 MED ORDER — SODIUM CHLORIDE 0.9 % IV SOLN: 250.0000 mL | INTRAVENOUS | Status: DC | PRN

## 2017-02-17 MED ORDER — MISOPROSTOL 200 MCG PO TABS
ORAL_TABLET | ORAL | Status: AC
  Filled 2017-02-17: qty 4

## 2017-02-17 MED ORDER — SODIUM CHLORIDE 0.9% FLUSH: 3.0000 mL | INTRAVENOUS | Status: DC | PRN

## 2017-02-17 MED ORDER — DINOPROSTONE 10 MG VA INST
10.0000 mg | VAGINAL_INSERT | Freq: Once | VAGINAL | Status: AC
Start: 2017-02-17 — End: 2017-02-17
  Administered 2017-02-17: 10 mg via VAGINAL
  Filled 2017-02-17: qty 1

## 2017-02-17 MED ORDER — OXYTOCIN 40 UNITS IN LACTATED RINGERS INFUSION - SIMPLE MED
1.0000 m[IU]/min | INTRAVENOUS | Status: DC
  Administered 2017-02-17: 1 m[IU]/min via INTRAVENOUS

## 2017-02-17 MED ORDER — BENZOCAINE-MENTHOL 20-0.5 % EX AERO: 1.0000 "application " | INHALATION_SPRAY | CUTANEOUS | Status: DC | PRN

## 2017-02-17 MED ORDER — SIMETHICONE 80 MG PO CHEW: 80.0000 mg | CHEWABLE_TABLET | ORAL | Status: DC | PRN

## 2017-02-17 MED ORDER — SOD CITRATE-CITRIC ACID 500-334 MG/5ML PO SOLN
30.0000 mL | ORAL | Status: DC | PRN
  Filled 2017-02-17: qty 30

## 2017-02-17 MED ORDER — BUPRENORPHINE HCL-NALOXONE HCL 8-2 MG SL SUBL: 1.0000 | SUBLINGUAL_TABLET | Freq: Every day | SUBLINGUAL | Status: DC

## 2017-02-17 MED ORDER — SODIUM CHLORIDE 0.9% FLUSH: 3.0000 mL | Freq: Two times a day (BID) | INTRAVENOUS | Status: DC

## 2017-02-17 MED ORDER — MEDROXYPROGESTERONE ACETATE 150 MG/ML IM SUSP: 150.0000 mg | INTRAMUSCULAR | Status: DC | PRN

## 2017-02-17 MED ORDER — SODIUM CHLORIDE 0.9 % IJ SOLN
INTRAMUSCULAR | Status: AC
Start: 2017-02-17 — End: 2017-02-17
  Filled 2017-02-17: qty 50

## 2017-02-17 MED ORDER — OXYTOCIN 40 UNITS IN LACTATED RINGERS INFUSION - SIMPLE MED
2.5000 [IU]/h | INTRAVENOUS | Status: DC
  Filled 2017-02-17: qty 1000

## 2017-02-17 MED ORDER — ONDANSETRON HCL 4 MG/2ML IJ SOLN: 4.0000 mg | INTRAMUSCULAR | Status: DC | PRN

## 2017-02-17 MED ORDER — ACETAMINOPHEN 325 MG PO TABS
650.0000 mg | ORAL_TABLET | ORAL | Status: DC | PRN
Start: 2017-02-17 — End: 2017-02-17

## 2017-02-17 MED ORDER — ONDANSETRON HCL 4 MG/2ML IJ SOLN: 4.0000 mg | Freq: Four times a day (QID) | INTRAMUSCULAR | Status: DC | PRN

## 2017-02-17 MED ORDER — ACETAMINOPHEN 325 MG PO TABS: 650.0000 mg | ORAL_TABLET | ORAL | Status: DC | PRN

## 2017-02-17 MED ORDER — OXYTOCIN BOLUS FROM INFUSION
500.0000 mL | Freq: Once | INTRAVENOUS | Status: DC
Start: 2017-02-17 — End: 2017-02-17

## 2017-02-17 MED ORDER — IBUPROFEN 600 MG PO TABS
600.0000 mg | ORAL_TABLET | Freq: Four times a day (QID) | ORAL | Status: DC
  Administered 2017-02-17 – 2017-02-19 (×9): 600 mg via ORAL
  Filled 2017-02-17 (×8): qty 1

## 2017-02-17 NOTE — Discharge Instructions (Signed)

## 2017-02-17 NOTE — Progress Notes (Signed)
  Labor Progress Note   26 y.o. [id number] @ [redacted]w[redacted]d , admitted for  Pregnancy, Labor Management. IOL POST DATES  Subjective:  Mod pain Reg ctxs on Pitocin 8 mU/min Foley bulb came out  Objective:  BP (!) 111/58 (BP Location: Left Arm)   Pulse 61   Temp 97.7 F (36.5 C) (Oral)   Resp 20   Ht  (1.626 m)   Wt 164 lb (74.4 kg)   LMP 04/20/2016   BMI 28.15 kg/m  Abd: mild Extr: trace to 1+ bilateral pedal edema SVE: 4/80/-2  EFM: FHR: 110 bpm, variability: minimal ,  accelerations:  Present,  decelerations:  Absent Toco: Frequency: Every 3-4 minutes Labs: I have reviewed the patient's lab results.   Assessment & Plan:  [id number] @ [redacted]w[redacted]d, admitted for  Pregnancy and Labor/Delivery Management  1. Pain management: none. 2. FWB: FHT category 1  (var good at times, no decels, possible Subutex effect).  3. ID: GBS negative 4. Labor management: Cont mgt of active of labor  All discussed with patient, see orders

## 2017-02-17 NOTE — H&P (Signed)
Obstetric H&P   Chief Complaint: Induction   Prenatal Care Provider: [facility]  History of Present Illness: 26 y.o. [id number] [redacted]w[redacted]d by 02/10/2017,presenting to L&D for postdates IOL.  +FM, no LOF, no VB.  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 Anxiety   . Seizures (HCC)     Past Surgical History: Past Surgical History:  Procedure Laterality Date  . NO PAST SURGERIES      Family History: Family History  Problem Relation Age of Onset  . Cancer Mother   . Diabetes Father   . Heart disease Father   . Cancer Paternal Grandmother     Social History: Social History   Social History  . Marital status: Married    Spouse name: N/A  . Number of children: N/A  . Years of education: N/A   Occupational History  . Not on file.   Social History Main Topics  . Smoking status: Current Every Day Smoker    Packs/day: 0.50    Types: E-cigarettes  . Smokeless tobacco: Never Used  . Alcohol use No  . Drug use: No  . Sexual activity: Yes    Birth control/ protection: Injection   Other Topics Concern  . Not on file   Social History Narrative  . No narrative on file    Medications: Prior to Admission medications   Medication Sig Start Date End Date Taking? Authorizing Provider  buprenorphine (SUBUTEX) 8 MG SUBL SL tablet DISSOLVE 1 T UNDER THE TONGUE BID FOR 7 DAYS 01/18/17  Yes Historical Provider, MD  nitrofurantoin, macrocrystal-monohydrate, (MACROBID) 100 MG capsule Take 1 capsule (100 mg total) by mouth at bedtime. Start  after completing 7 day course of Macrobid BID 01/08/17  Yes Farrel Conners, CNM  Prenatal Vit-Fe Fumarate-FA (PRENATAL MULTIVITAMIN) TABS tablet Take 1 tablet by mouth daily at 12 noon.   Yes Historical Provider, MD    Allergies: No Known Allergies  Physical Exam: Vitals: Blood pressure 122/76, pulse 66, temperature 97.7 F (36.5 C), temperature source Oral, resp. rate 18, height  (1.626 m), weight 164 lb (74.4 kg), last menstrual period 04/20/2016.  Urine Dip Protein: N/A  FHT: 10, moderate, +accels, no decels Toco: none  General: NAD HEENT: normocephalic, anicteric Pulmonary: No increased work of breathing Cardiovascular: RRR, distal pulses 2+ Abdomen: Gravid, non-tender Extremities: no edema, erythema, or tenderness Neurologic: Grossly intact Psychiatric: mood appropriate, affect full  Labs: No results found for this or any previous visit (from the past 24 hour(s)).  Assessment: 26 y.o. [id number] [redacted]w[redacted]d by 02/10/2017, postdates IOL  Plan: 1) IOL - cervidil tonight  2) Fetus - cat I tracing  3) Substance abuse - continue subutex  4) Disposition - pending delivery

## 2017-02-17 NOTE — Discharge Summary (Signed)
OB Discharge Summary     [name] DOB: 06-13-1991 MRN: [id number]  Date of admission: 02/16/2017 Delivering MD: Farrel Conners, CNM  Date of Delivery: 02/19/2017  Date of discharge: 02/19/2017 Admitting diagnosis: 41 weeks preg induction Intrauterine pregnancy: [redacted]w[redacted]d     Secondary diagnosis: Chronic Substance Abuse     Discharge diagnosis: Term Pregnancy Delivered                                                                                                Post partum procedures:none  Augmentation: AROM, Pitocin and Foley Balloon  Complications: None  Hospital course:  Induction of Labor With Vaginal Delivery   26 y.o. yo [id number] at [redacted]w[redacted]d was admitted to the hospital 02/16/2017 for induction of labor.  Indication for induction: Postdates.  Patient had an uncomplicated labor course as follows: Membrane Rupture Time/Date: 5:51 PM ,02/17/2017   Intrapartum Procedures: Episiotomy: None [1]                                         Lacerations:  None [1]  Patient had delivery of a Viable infant.  Information for the patient's newborn:  Rocquel, Askren [[id number]]  Delivery Method: Vag-Spont  Delivery Note Primary OB: [facility] Delivery Physician: Annamarie Major, MD Gestational Age: Full term Antepartum complications: Substance abuse history on Subutex Intrapartum complications: None  A viable Female was delivered via vertex perentation.  Apgars:8 ,9  Weight:  8 lb 0 oz .   Placenta status: spontaneous and Intact.  Cord: 3+ vessels;  with the following complications: none.  Anesthesia:  none Episiotomy:  none Lacerations:  right vaginal side wall small lac Suture Repair: none Est. Blood Loss (mL):  less than 100 mL  Mom to postpartum.  Baby to Couplet care / Skin to Skin.  Annamarie Major, MD  02/17/2017  Details of delivery can be found in separate delivery note.  Patient had a routine postpartum course. Patient is discharged home 02/19/17.  Physical  exam  Vitals:   02/18/17 1522 02/18/17 1900 02/18/17 2300 02/19/17 0800  BP: 115/63 114/60 116/60 134/80  Pulse: (!) 59 60 (!) 56 61  Resp: Temp: 97.7 F (36.5 C) 97.6 F (36.4 C) 98.1 F (36.7 C) 97.7 F (36.5 C)  TempSrc: Oral Oral Oral Oral  SpO2:  99% 100% 100%  Weight:      Height:       General: alert, cooperative and no distress Lochia: appropriate Uterine Fundus: firm/ U-2/ ML/ NT DVT Evaluation: No evidence of DVT seen on physical exam.  Labs: Lab Results  Component Value Date   WBC 13.2 (H) 02/18/2017   HGB 10.9 (L) 02/18/2017   HCT 31.6 (L) 02/18/2017   MCV 97.1 02/18/2017   PLT 127 (L) 02/18/2017   CMP Latest Ref Rng & Units 10/05/2012  Glucose 65 - 99 mg/dL 130(Q)  BUN 7 - 18 mg/dL 14  Creatinine 6.57 - 8.46 mg/dL 9.62  Sodium 952 - 841  mmol/L 141  Potassium 3.5 - 5.1 mmol/L 3.5  Chloride 98 - 107 mmol/L 108(H)  CO2 21 - 32 mmol/L 26  Calcium 8.5 - 10.1 mg/dL 8.8  Total Protein 6.4 - 8.2 g/dL -  Total Bilirubin 0.2 - 1.0 mg/dL -  Alkaline Phos 50 - 161 Unit/L -  AST 15 - 37 Unit/L -  ALT 12 - 78 U/L -    Discharge instruction: per After Visit Summary.  Medications:  Allergies as of 02/19/2017   No Known Allergies     Medication List    TAKE these medications   buprenorphine 8 MG Subl SL tablet Commonly known as:  SUBUTEX DISSOLVE 1 T UNDER THE TONGUE BID FOR 7 DAYS   medroxyPROGESTERone 150 MG/ML injection Commonly known as:  DEPO-PROVERA Inject 1 mL (150 mg total) into the muscle every 3 (three) months.   nitrofurantoin (macrocrystal-monohydrate) 100 MG capsule Commonly known as:  MACROBID Take 1 capsule (100 mg total) by mouth at bedtime. Start after completing 7 day course of Macrobid BID   prenatal multivitamin Tabs tablet Take 1 tablet by mouth daily at 12 noon.       Diet: routine diet  Activity: Advance as tolerated. Pelvic rest for 6 weeks.   Outpatient follow up: Follow-up Information    Letitia Libra, MD. Schedule an appointment as soon as possible for a visit in 6 week(s).   Specialty:  Obstetrics and Gynecology Contact information: [address] [place] [address] [phone]             Postpartum contraception: Depo Provera Rhogam Given postpartum: no Rubella vaccine given postpartum: no Varicella vaccine given postpartum: no TDaP given antepartum or postpartum: AP  Newborn Data: Female/ Richardson Dopp Birth Weight: 8#   APGAR: 8, 9   Baby Feeding: Breast  Disposition:not discharged-remaining in the hospital for observation for NAS  SIGNED:  Farrel Conners, CNM 02/19/2017 9:45 AM

## 2017-02-17 NOTE — Progress Notes (Signed)
Subjective:  Doing well no concerns  Objective:   Vitals: Blood pressure 109/62, pulse (!) 58, temperature 97.8 F (36.6 C), temperature source Oral, resp. rate 18, height  (1.626 m), weight 164 lb (74.4 kg), last menstrual period 04/20/2016. General: NAD Abdomen: gravid, non-tender Cervical Exam: 1/70/-2  FHT: 110. Moderate, +accels, no decels Toco: q26min  Results for orders placed or performed during the hospital encounter of 02/16/17 (from the past 24 hour(s))  CBC     Status: Abnormal   Collection Time: 02/17/17  3:39 AM  Result Value Ref Range   WBC 7.4 3.6 - 11.0 K/uL   RBC 3.37 (L) 3.80 - 5.20 MIL/uL   Hemoglobin 11.5 (L) 12.0 - 16.0 g/dL   HCT 45.4 (L) 09.8 - 11.9 %   MCV 96.6 80.0 - 100.0 fL   MCH 34.2 (H) 26.0 - 34.0 pg   MCHC 35.4 32.0 - 36.0 g/dL   RDW 14.7 82.9 - 56.2 %   Platelets 133 (L) 150 - 440 K/uL  Type and screen [facility]     Status: None   Collection Time: 02/17/17  3:39 AM  Result Value Ref Range   ABO/RH(D) O POS    Antibody Screen NEG    Sample Expiration 02/20/2017   Urine Drug Screen, Qualitative ([facility] only)     Status: None   Collection Time: 02/17/17  3:39 AM  Result Value Ref Range   Tricyclic, Ur Screen NONE DETECTED NONE DETECTED   Amphetamines, Ur Screen NONE DETECTED NONE DETECTED   MDMA (Ecstasy)Ur Screen NONE DETECTED NONE DETECTED   Cocaine Metabolite,Ur [place] NONE DETECTED NONE DETECTED   Opiate, Ur Screen NONE DETECTED NONE DETECTED   Phencyclidine (PCP) Ur S NONE DETECTED NONE DETECTED   Cannabinoid 50 Ng, Ur  NONE DETECTED NONE DETECTED   Barbiturates, Ur Screen NONE DETECTED NONE DETECTED   Benzodiazepine, Ur Scrn NONE DETECTED NONE DETECTED   Methadone Scn, Ur NONE DETECTED NONE DETECTED    Assessment:   26 y.o. 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

## 2017-02-17 NOTE — Progress Notes (Signed)
  Labor Progress Note   26 y.o. [id number] @ [redacted]w[redacted]d , admitted for  Pregnancy, Labor Management. IOL POST DATES  Subjective:  Mod 0 severe pain Reg ctxs on Pitocin 14 mU/min  Objective:  BP (!) 111/58 (BP Location: Left Arm)   Pulse 61   Temp 97.7 F (36.5 C) (Oral)   Resp 20   Ht  (1.626 m)   Wt 164 lb (74.4 kg)   LMP 04/20/2016   BMI 28.15 kg/m  Abd: mild Extr: trace to 1+ bilateral pedal edema SVE: 6/80/-1, AROM clear  EFM: FHR: 110 bpm, variability: minimal ,  accelerations:  Present,  decelerations:  Absent Toco: Frequency: Every 3-4 minutes  Assessment & Plan:  [id number] @ [redacted]w[redacted]d, admitted for  Pregnancy and Labor/Delivery Management  1. Pain management: none. 2. FWB: FHT category 1  (var good at times, no decels, possible Subutex effect).  3. ID: GBS negative 4. Labor management: Cont mgt of active of labor. AROM done.   All discussed with patient, see orders

## 2017-02-17 NOTE — Progress Notes (Signed)
  Labor Progress Note   26 y.o. [id number] @ [redacted]w[redacted]d , admitted for  Pregnancy, Labor Management. IOL POST DATES  Subjective:  Min pain, +appetite Irreg ctxs now  Objective:  BP (!) 111/58 (BP Location: Left Arm)   Pulse 61   Temp 97.7 F (36.5 C) (Oral)   Resp 20   Ht  (1.626 m)   Wt 164 lb (74.4 kg)   LMP 04/20/2016   BMI 28.15 kg/m  Abd: mild Extr: trace to 1+ bilateral pedal edema SVE: foleu bulb in place  EFM: FHR: 110 bpm, variability: minimal ,  accelerations:  Present,  decelerations:  Absent Toco: Frequency: Every 5-8 minutes Labs: I have reviewed the patient's lab results.   Assessment & Plan:  [id number] @ [redacted]w[redacted]d, admitted for  Pregnancy and Labor/Delivery Management  1. Pain management: none. 2. FWB: FHT category 1  (var good at times, no decels, possible Subutex effect).  3. ID: GBS negative 4. Labor management: Pitocin starting now.  Counseled on labor plan of mgt.  Monitor FHT during laboe.  Prior vag delivery, anticipate SVD as well; risks of CS due to fetal well-being discussed.  Anesthesia also discussed, in light of Subutex use and chronic substance abuse history.  All discussed with patient, see orders

## 2017-02-18 LAB — CBC
HEMATOCRIT: 31.6 % — AB (ref 35.0–47.0)
Hemoglobin: 10.9 g/dL — ABNORMAL LOW (ref 12.0–16.0)
MCH: 33.6 pg (ref 26.0–34.0)
MCHC: 34.6 g/dL (ref 32.0–36.0)
MCV: 97.1 fL (ref 80.0–100.0)
PLATELETS: 127 10*3/uL — AB (ref 150–440)
RBC: 3.25 MIL/uL — AB (ref 3.80–5.20)
RDW: 12.9 % (ref 11.5–14.5)
WBC: 13.2 10*3/uL — AB (ref 3.6–11.0)

## 2017-02-18 LAB — RPR: RPR Ser Ql: NONREACTIVE

## 2017-02-18 NOTE — Progress Notes (Signed)
Admit Date: 02/16/2017 Today's Date: 02/18/2017  Post Partum Day 1  Subjective:  no complaints, up ad lib, voiding and tolerating PO  Objective: Temp:  [97.4 F (36.3 C)-98.6 F (37 C)] 97.7 F (36.5 C) (04/15 0857) Pulse Rate:  [60-91] 60 (04/15 0857) Resp:  [18-20] 18 (04/15 0857) BP: (106-131)/(55-97) 106/56 (04/15 0857) SpO2:  [98 %-99 %] 99 % (04/15 0857)  Physical Exam:  General: alert, cooperative and no distress Lochia: appropriate Uterine Fundus: firm Incision: none DVT Evaluation: No evidence of DVT seen on physical exam. Negative Homan's sign.   Recent Labs  02/17/17 0339 02/18/17 0533  HGB 11.5* 10.9*  HCT 32.6* 31.6*    Assessment/Plan: Plan for discharge tomorrow, Breastfeeding and Infant doing well  Subutex- continue dosing; peds to monitor infant status O+, RI, VI, GBS- Depo for pp BC   LOS: 1 day   Letitia Libra [facility] 02/18/2017, 9:55 AM

## 2017-02-18 NOTE — Lactation Note (Signed)
This note was copied from a baby's chart. Lactation Consultation Note Betty Powell has a tight, fleshy, thin lingual frenulum near tongue tip.  He appears to be stretching it enough to increase the range of motion of the tongue but needs to be evaluated by Pediatrician.  Tongue is not heart shaped, but flat.  Betty Powell can slightly form a central groove with his tongue. Betty Powell has only had a 3% weight loss in 24 hrs and his transcutaneous bilirubin is only 5.4.  Mom has an abundant supply of colostrum spraying when hand expressing.  She only pumped 1 to 2 ml in short period but explained how ineffective a pump was to pump out the viscous colostrum.  Mom agrees to keep hand expressing. Mom's nipples are sore which could be from frenulum issues.  Father of baby verbalized that he had a tight frenulum that had to be clipped after birth.  Encouraged mom to discuss with Pediatrician.  Betty Powell will not be discharged in next day or two d/t mom on Subutex giving [place] time to continue to evaluate frenulum.   [name] [id number] Date: 02/18/2017 Reason for consult: Follow-up assessment   Maternal Data Formula Feeding for Exclusion: No Has patient been taught Hand Expression?: Yes Does the patient have breastfeeding experience prior to this delivery?: Yes  Feeding Feeding Type: Breast Fed Length of feed: 12 min  LATCH Score/Interventions Latch: Repeated attempts needed to sustain latch, nipple held in mouth throughout feeding, stimulation needed to elicit sucking reflex. (Tight frenulum noted) Intervention(s): Adjust position;Assist with latch;Breast massage;Breast compression  Audible Swallowing: A few with stimulation Intervention(s): Hand expression  Type of Nipple: Everted at rest and after stimulation  Comfort (Breast/Nipple): Filling, red/small blisters or bruises, mild/mod discomfort  Problem noted: Mild/Moderate discomfort Interventions (Mild/moderate discomfort): Hand massage;Hand  expression;Reverse pressue;Post-pump;Comfort gels  Hold (Positioning): No assistance needed to correctly position infant at breast. Intervention(s): Support Pillows  LATCH Score: 7  Lactation Tools Discussed/Used Tools: Pump;Comfort gels (Coconut Oil) Breast pump type: Double-Electric Breast Pump (Symphony set up in room per mom's request) WIC Program: Yes Pump Review: Setup, frequency, and cleaning;Milk Storage Initiated by:: S.Alesi Zachery,RN,BSN,IBCLC Date initiated:: 02/18/17   Consult Status Consult Status: PRN Follow-up type: Call as needed    Louis Meckel 02/18/2017, 8:19 PM

## 2017-02-19 ENCOUNTER — Encounter: Payer: Self-pay | Admitting: Certified Nurse Midwife

## 2017-02-19 MED ORDER — MEDROXYPROGESTERONE ACETATE 150 MG/ML IM SUSP: 150.0000 mg | INTRAMUSCULAR | 0 refills | Status: DC

## 2017-02-19 NOTE — Progress Notes (Signed)
Pt discharged with verbal understanding of discharge instructions. To receive depo at 6 week check up. Is remaining in room with infant. Infant transferring to pediatrics.

## 2017-02-19 NOTE — Lactation Note (Signed)
Lactation Consultation Note  [name] [id number] Date: 02/19/2017  I spoke with mother earlier today about breastfeeding. She claimed baby was "nursing well" and denied nipple pain getting worse. I stated that I like to assess at least one feeding a day and for her to call me with next feed ( she had just finished).    Maternal Data    Feeding    [facility] Score/Interventions                      Lactation Tools Discussed/Used     Consult Status      Sunday Corn 02/19/2017, 4:00 PM

## 2017-04-30 ENCOUNTER — Ambulatory Visit (INDEPENDENT_AMBULATORY_CARE_PROVIDER_SITE_OTHER): Payer: Medicaid Other | Admitting: Obstetrics and Gynecology

## 2017-04-30 ENCOUNTER — Encounter: Payer: Self-pay | Admitting: Obstetrics and Gynecology

## 2017-04-30 ENCOUNTER — Telehealth: Payer: Self-pay | Admitting: Certified Nurse Midwife

## 2017-04-30 MED ORDER — MEDROXYPROGESTERONE ACETATE 150 MG/ML IM SUSP: 150.0000 mg | INTRAMUSCULAR | 3 refills | Status: DC

## 2017-04-30 NOTE — Progress Notes (Signed)
Postpartum Visit  Chief Complaint:  Chief Complaint  Patient presents with  . Postpartum Care    discuss Nexplanon    History of Present Illness: Patient is a 26 y.o. [id number] presents for postpartum visit.  Date of delivery: 02/17/17 Type of delivery: Vaginal delivery - Vacuum or forceps assisted  yes Episiotomy No.  Laceration: no  Pregnancy or labor problems:  Yes substance abuse on subutex Any problems since the delivery:  no  Newborn Details:  SINGLETON :  1. Birth weight: 8lbs Maternal Details:  Breast Feeding:  no Post partum depression/anxiety noted:  no Edinburgh Post-Partum Depression Score:  9  Date of last PAP: 02/17/17  normal   Review of Systems: Review of Systems  Constitutional: Negative for chills and fever.  Gastrointestinal: Negative for abdominal pain.  Psychiatric/Behavioral: Negative for depression and suicidal ideas. The patient is not nervous/anxious and does not have insomnia.     Past Medical History:  Past Medical History:  Diagnosis Date  . Anxiety   . Seizures (HCC)     Past Surgical History:  Past Surgical History:  Procedure Laterality Date  . NO PAST SURGERIES      Family History:  Family History  Problem Relation Age of Onset  . Cancer Mother   . Diabetes Father   . Heart disease Father   . Cancer Paternal Grandmother     Social History:  Social History   Social History  . Marital status: Married    Spouse name: N/A  . Number of children: N/A  . Years of education: N/A   Occupational History  . Not on file.   Social History Main Topics  . Smoking status: Current Every Day Smoker    Packs/day: 0.50    Types: E-cigarettes  . Smokeless tobacco: Never Used  . Alcohol use No  . Drug use: No  . Sexual activity: Yes    Birth control/ protection: Injection   Other Topics Concern  . Not on file   Social History Narrative  . No narrative on file    Allergies:  No Known Allergies  Medications: Prior to  Admission medications   Medication Sig Start Date End Date Taking? Authorizing Provider  buprenorphine (SUBUTEX) 8 MG SUBL SL tablet DISSOLVE 1 T UNDER THE TONGUE BID FOR 7 DAYS 01/18/17  Yes [provider]  nitrofurantoin, macrocrystal-monohydrate, (MACROBID) 100 MG capsule Take 1 capsule (100 mg total) by mouth at bedtime. Start after completing 7 day course of Macrobid BID 01/08/17  Yes Farrel Conners, CNM  Prenatal Vit-Fe Fumarate-FA (PRENATAL MULTIVITAMIN) TABS tablet Take 1 tablet by mouth daily at 12 noon.   Yes [provider]    Physical Exam Vitals:  Vitals:   04/30/17 1119  BP: 110/64  Pulse: 74    General: NAD HEENT: normocephalic, anicteric Pulmonary: No increased work of breathing Abdomen: NABS, soft, non-tender, non-distended.  Umbilicus without lesions.  No hepatomegaly, splenomegaly or masses palpable. No evidence of hernia.* Genitourinary:  External: Normal external female genitalia.  Normal urethral meatus, normal  Bartholin's and Skene's glands.    Vagina: Normal vaginal mucosa, no evidence of prolapse.    Cervix: Grossly normal in appearance, no bleeding  Uterus: Non-enlarged, mobile, normal contour.  No CMT  Adnexa: ovaries non-enlarged, no adnexal masses  Rectal: deferred Extremities: no edema, erythema, or tenderness Neurologic: Grossly intact Psychiatric: mood appropriate, affect full  Assessment: 26 y.o. [id number] presenting for 6 week postpartum visit  Plan: Problem List Items Addressed  This Visit    None    Visit Diagnoses    Encounter for postpartum visit    -  Primary       1) Contraception Education given regarding options for contraception, interested in nexplanon.  Arrived 10 minutes late and not scheduled for nexplanon insertion today.  Follow up 1 week for insertion  2)  Pap - ASCCP guidelines and rational discussed.  Patient opts for q3year screening interval  3) Patient underwent screening for postpartum depression  with no concerns noted.  4) Follow up 1 year for routine annual exam

## 2017-04-30 NOTE — Telephone Encounter (Signed)
PT coming 7/5 at 8:50 with CLG for nexplanon insert

## 2017-05-01 NOTE — Telephone Encounter (Signed)
Noted. Will order to arrive by apt date/time. 

## 2017-05-04 NOTE — Telephone Encounter (Signed)
Nexplanon stock reserved for this patient. 

## 2017-05-08 ENCOUNTER — Emergency Department: Payer: Medicaid Other

## 2017-05-08 ENCOUNTER — Emergency Department
Admission: EM | Admit: 2017-05-08 | Discharge: 2017-05-08 | Disposition: A | Discharge status: Home / Self Care | Payer: Medicaid Other | Attending: Emergency Medicine | Admitting: Emergency Medicine

## 2017-05-08 DIAGNOSIS — Y92003 Bedroom of unspecified non-institutional (private) residence as the place of occurrence of the external cause: Secondary | ICD-10-CM | POA: Diagnosis not present

## 2017-05-08 DIAGNOSIS — Y999 Unspecified external cause status: Secondary | ICD-10-CM | POA: Insufficient documentation

## 2017-05-08 DIAGNOSIS — F17290 Nicotine dependence, other tobacco product, uncomplicated: Secondary | ICD-10-CM | POA: Insufficient documentation

## 2017-05-08 DIAGNOSIS — S99921A Unspecified injury of right foot, initial encounter: Secondary | ICD-10-CM | POA: Diagnosis present

## 2017-05-08 DIAGNOSIS — W2209XA Striking against other stationary object, initial encounter: Secondary | ICD-10-CM | POA: Diagnosis not present

## 2017-05-08 DIAGNOSIS — Y9301 Activity, walking, marching and hiking: Secondary | ICD-10-CM | POA: Insufficient documentation

## 2017-05-08 DIAGNOSIS — S92414A Nondisplaced fracture of proximal phalanx of right great toe, initial encounter for closed fracture: Secondary | ICD-10-CM

## 2017-05-08 MED ORDER — IBUPROFEN 600 MG PO TABS: 600.0000 mg | ORAL_TABLET | Freq: Four times a day (QID) | ORAL | 0 refills | Status: DC | PRN

## 2017-05-08 NOTE — ED Triage Notes (Signed)
Pt reports she stubbed her rt foot great toe this am.

## 2017-05-08 NOTE — ED Notes (Signed)
See triage note  States she hit her right great toe early this am. Right great toe swollen and bruising noted

## 2017-05-08 NOTE — ED Provider Notes (Signed)
[facility] Emergency Department Provider Note  ____________________________________________  Time seen: Approximately 11:55 AM  I have reviewed the triage vital signs and the nursing notes.   HISTORY  Chief Complaint Toe Injury    HPI Betty Powell is a 26 y.o. female that presents to the emergency department with right great toe pain for one day. Patient stubbed her toein the middle of the night last night. She has had pain with walking since. It is bruising and swelling at the base of her toe. She took Motrin this morning for pain which helped. She denies any additional injuries. No shortness of breath, chest pain, nausea, vomiting, abdominal pain.   Past Medical History:  Diagnosis Date  . Anxiety   . Seizures [facility](HCC)     Patient Active Problem List   Diagnosis Date Noted  . Postpartum care following vaginal delivery 02/19/2017  . Substance abuse affecting pregnancy, antepartum 01/08/2017  . Urinary tract infection affecting care of mother, antepartum 01/08/2017    Past Surgical History:  Procedure Laterality Date  . NO PAST SURGERIES      Prior to Admission medications   Medication Sig Start Date End Date Taking? Authorizing Provider  buprenorphine (SUBUTEX) 8 MG SUBL SL tablet DISSOLVE 1 T UNDER THE TONGUE BID FOR 7 DAYS 01/18/17   [provider]  ibuprofen (ADVIL,MOTRIN) 600 MG tablet Take 1 tablet (600 mg total) by mouth every 6 (six) hours as needed. 05/08/17   Enid DerryWagner, Charnise Lovan, PA-C  nitrofurantoin, macrocrystal-monohydrate, (MACROBID) 100 MG capsule Take 1 capsule (100 mg total) by mouth at bedtime. Start after completing 7 day course of Macrobid BID 01/08/17   Farrel ConnersGutierrez, Colleen, CNM  Prenatal Vit-Fe Fumarate-FA (PRENATAL MULTIVITAMIN) TABS tablet Take 1 tablet by mouth daily at 12 noon.    [provider]    Allergies Patient has no known allergies.  Family History  Problem Relation Age of Onset  . Cancer Mother    . Diabetes Father   . Heart disease Father   . Cancer Paternal Grandmother     Social History Social History  Substance Use Topics  . Smoking status: Current Every Day Smoker    Packs/day: 0.50    Types: E-cigarettes  . Smokeless tobacco: Never Used  . Alcohol use No     Review of Systems  Constitutional: No fever/chills Cardiovascular: No chest pain. Respiratory: No SOB. Gastrointestinal: No abdominal pain.  No nausea, no vomiting.  Musculoskeletal: Positive for toe pain. Skin: Negative for rash, abrasions, lacerations. Positive for ecchymosis on toe. Neurological: Negative for headaches, numbness or tingling   ____________________________________________   PHYSICAL EXAM:  VITAL SIGNS: ED Triage Vitals  Enc Vitals Group     BP 05/08/17 1124 119/73     Pulse Rate 05/08/17 1124 88     Resp 05/08/17 1124 16     Temp 05/08/17 1124 97.7 F (36.5 C)     Temp Source 05/08/17 1124 Oral     SpO2 05/08/17 1124 98 %     Weight 05/08/17 1123 146 lb (66.2 kg)     Height 05/08/17 1123 5\' 4"  (1.626 m)     Head Circumference --      Peak Flow --      Pain Score 05/08/17 1123 5     Pain Loc --      Pain Edu? --      Excl. in GC? --      Constitutional: Alert and oriented. Well appearing and in no acute  distress. Eyes: Conjunctivae are normal. PERRL. EOMI. Head: Atraumatic. ENT:      Ears:      Nose: No congestion/rhinnorhea.      Mouth/Throat: Mucous membranes are moist.  Neck: No stridor.   Cardiovascular: Normal rate, regular rhythm.  Good peripheral circulation.2+ dorsalis pedis pulses. Respiratory: Normal respiratory effort without tachypnea or retractions. Lungs CTAB. Good air entry to the bases with no decreased or absent breath sounds. Musculoskeletal: Full range of motion to all extremities. No gross deformities appreciated. Neurologic:  Normal speech and language. No gross focal neurologic deficits are appreciated. Sensation of toe intact. Skin:  Skin is  warm, dry and intact. Bruising over right great toe. Psychiatric: Mood and affect are normal. Speech and behavior are normal. Patient exhibits appropriate insight and judgement.   ____________________________________________   LABS (all labs ordered are listed, but only abnormal results are displayed)  Labs Reviewed - No data to display ____________________________________________  EKG   ____________________________________________  RADIOLOGY Lexine Baton, personally viewed and evaluated these images (plain radiographs) as part of my medical decision making, as well as reviewing the written report by the radiologist.  Dg Foot Complete Right  Result Date: 05/08/2017 CLINICAL DATA:  Hip foot today with pain, swelling and bruising of the great toe EXAM: RIGHT FOOT COMPLETE - 3+ VIEW COMPARISON:  None. FINDINGS: There is a nondisplaced fracture through the mid proximal aspect of the proximal phalanx of the right great toe with soft tissue swelling. No other acute abnormality is seen. Joint spaces appear normal. IMPRESSION: Nondisplaced fracture of the proximal phalanx of the right great toe. Electronically Signed   By: Dwyane Dee M.D.   On: 05/08/2017 11:57    ____________________________________________    PROCEDURES  Procedure(s) performed:    Procedures    Medications - No data to display   ____________________________________________   INITIAL IMPRESSION / ASSESSMENT AND PLAN / ED COURSE  Pertinent labs & imaging results that were available during my care of the patient were reviewed by me and considered in my medical decision making (see chart for details).  Review of the [place] CSRS was performed in accordance of the [facility] prior to dispensing any controlled drugs.     Patient's diagnosis is consistent with nondisplaced toe fracture. Vital signs and exam are reassuring. X-ray consistent with fracture. Toe was buddy tapped and shoe and crutches were given. Patient  will be discharged home with prescriptions for ibuprofen. Patient is to follow up with podiatry as directed. Patient is given ED precautions to return to the ED for any worsening or new symptoms.     ____________________________________________  FINAL CLINICAL IMPRESSION(S) / ED DIAGNOSES  Final diagnoses:  Closed nondisplaced fracture of proximal phalanx of right great toe, initial encounter      NEW MEDICATIONS STARTED DURING THIS VISIT:  Discharge Medication List as of 05/08/2017 12:25 PM    START taking these medications   Details  ibuprofen (ADVIL,MOTRIN) 600 MG tablet Take 1 tablet (600 mg total) by mouth every 6 (six) hours as needed., Starting Tue 05/08/2017, Print            This chart was dictated using voice recognition software/Dragon. Despite best efforts to proofread, errors can occur which can change the meaning. Any change was purely unintentional.    Enid Derry, PA-C 05/08/17 1356    Governor Rooks, MD 05/08/17 [phone]

## 2017-05-08 NOTE — Progress Notes (Signed)
Small amount of leakage clear fluid this AM, none since Nitrazine negative Cervix 1/50%/-1 Scheduled for IOL 13 April/ Cervidil. Discussed risks of continued observation vs risks of induction. Also explained induction process, use of Cervidil, Cytotec, Pitocin and foley bulb. Patient agrees with plan. Labor precautions

## 2017-05-10 ENCOUNTER — Ambulatory Visit (INDEPENDENT_AMBULATORY_CARE_PROVIDER_SITE_OTHER): Payer: Medicaid Other | Admitting: Certified Nurse Midwife

## 2017-05-10 ENCOUNTER — Encounter: Payer: Self-pay | Admitting: Certified Nurse Midwife

## 2017-05-10 VITALS — BP 100/60 | HR 70 | Ht 64.0 in | Wt 143.0 lb

## 2017-05-10 DIAGNOSIS — Z30017 Encounter for initial prescription of implantable subdermal contraceptive: Secondary | ICD-10-CM | POA: Diagnosis not present

## 2017-05-10 DIAGNOSIS — S92401A Displaced unspecified fracture of right great toe, initial encounter for closed fracture: Secondary | ICD-10-CM | POA: Insufficient documentation

## 2017-05-10 MED ORDER — ETONOGESTREL 68 MG ~~LOC~~ IMPL: 68.0000 mg | DRUG_IMPLANT | Freq: Once | SUBCUTANEOUS | Status: DC

## 2017-05-10 NOTE — Progress Notes (Signed)
  Nexplanon Insertion  Patient given informed consent, signed copy in the chart, time out was performed. Patient breastfeeding and is amenorrheic Delivered 02/17/2017 Appropriate time out taken.  Patient's left arm was prepped and draped in the usual sterile fashion. The insertion site was marked and was at least 10 cm from the elbow.  Pt was prepped with betadine swab and then injected with 2 cc of 2% lidocaine.  Nexplanon removed form packaging,  Device confirmed in needle, then inserted full length of needle and withdrawn per handbook instructions. The Nexplanon was palpated by the examiner Pt insertion site covered with a bandage and a Curlex.   Minimal blood loss.  Pt was anxious but tolerated the procedure well. Given written post insertion instructions.

## 2017-05-17 ENCOUNTER — Encounter: Payer: Self-pay | Admitting: Emergency Medicine

## 2017-05-17 ENCOUNTER — Emergency Department
Admission: EM | Admit: 2017-05-17 | Discharge: 2017-05-17 | Disposition: A | Discharge status: Home / Self Care | Payer: Medicaid Other | Attending: Emergency Medicine | Admitting: Emergency Medicine

## 2017-05-17 DIAGNOSIS — F17290 Nicotine dependence, other tobacco product, uncomplicated: Secondary | ICD-10-CM | POA: Insufficient documentation

## 2017-05-17 DIAGNOSIS — L237 Allergic contact dermatitis due to plants, except food: Secondary | ICD-10-CM | POA: Insufficient documentation

## 2017-05-17 DIAGNOSIS — Z791 Long term (current) use of non-steroidal anti-inflammatories (NSAID): Secondary | ICD-10-CM | POA: Diagnosis not present

## 2017-05-17 DIAGNOSIS — L299 Pruritus, unspecified: Secondary | ICD-10-CM | POA: Diagnosis present

## 2017-05-17 DIAGNOSIS — L255 Unspecified contact dermatitis due to plants, except food: Secondary | ICD-10-CM

## 2017-05-17 MED ORDER — DIPHENHYDRAMINE HCL 25 MG PO TABS: 25.0000 mg | ORAL_TABLET | Freq: Four times a day (QID) | ORAL | 0 refills | Status: DC | PRN

## 2017-05-17 MED ORDER — HYDROCORTISONE VALERATE 0.2 % EX OINT: TOPICAL_OINTMENT | CUTANEOUS | 1 refills | Status: DC

## 2017-05-17 NOTE — ED Provider Notes (Signed)
[facility] Emergency Department Provider Note  ____________________________________________   First MD Initiated Contact with Patient 05/17/17 1351     (approximate)  I have reviewed the triage vital signs and the nursing notes.   HISTORY  Chief Complaint Poison Ivy    HPI Betty Powell is a 26 y.o. female patient complaining of acute onset of a rash 3 days. Patient stated rash is itchy. Patient state her house is surrounded by poison ivy. No palliative measures for complaint secondary to breast-feeding. Patient denies pain.   Past Medical History:  Diagnosis Date  . Anxiety   . Seizures [facility](HCC)     Patient Active Problem List   Diagnosis Date Noted  . Closed fracture of great toe of right foot 05/10/2017  . Substance abuse 01/08/2017    Past Surgical History:  Procedure Laterality Date  . NO PAST SURGERIES      Prior to Admission medications   Medication Sig Start Date End Date Taking? Authorizing Provider  buprenorphine (SUBUTEX) 8 MG SUBL SL tablet DISSOLVE 1 T UNDER THE TONGUE BID FOR 7 DAYS 01/18/17   [provider]  diphenhydrAMINE (BENADRYL) 25 MG tablet Take 1 tablet (25 mg total) by mouth every 6 (six) hours as needed. 05/17/17   Joni ReiningSmith, Evyn Kooyman K, PA-C  hydrocortisone valerate ointment (WESTCORT) 0.2 % Apply to affected area daily 05/17/17 05/17/18  Joni ReiningSmith, Alcides Nutting K, PA-C  ibuprofen (ADVIL,MOTRIN) 600 MG tablet Take 1 tablet (600 mg total) by mouth every 6 (six) hours as needed. 05/08/17   Enid DerryWagner, Ashley, PA-C  Prenatal Vit-Fe Fumarate-FA (PRENATAL MULTIVITAMIN) TABS tablet Take 1 tablet by mouth daily at 12 noon.    [provider]    Allergies Patient has no known allergies.  Family History  Problem Relation Age of Onset  . Cancer Mother   . Diabetes Father   . Heart disease Father   . Cancer Paternal Grandmother     Social History Social History  Substance Use Topics  . Smoking status: Current Every Day  Smoker    Packs/day: 0.50    Types: E-cigarettes  . Smokeless tobacco: Never Used  . Alcohol use No    Review of Systems Constitutional: No fever/chills Eyes: No visual changes. ENT: No sore throat. Cardiovascular: Denies chest pain. Respiratory: Denies shortness of breath. Gastrointestinal: No abdominal pain.  No nausea, no vomiting.  No diarrhea.  No constipation. Genitourinary: Negative for dysuria. Musculoskeletal: Negative for back pain. Skin: Negative for rash. Neurological: Negative for headaches, focal weakness or numbness.History of seizures Psychiatric:Anxiety  ____________________________________________   PHYSICAL EXAM:  VITAL SIGNS: ED Triage Vitals  Enc Vitals Group     BP 05/17/17 1341 112/78     Pulse Rate 05/17/17 1341 78     Resp 05/17/17 1341 20     Temp 05/17/17 1341 98 F (36.7 C)     Temp Source 05/17/17 1341 Oral     SpO2 05/17/17 1341 98 %     Weight 05/17/17 1326 142 lb (64.4 kg)     Height 05/17/17 1326 5\' 4"  (1.626 m)     Head Circumference --      Peak Flow --      Pain Score 05/17/17 1325 0     Pain Loc --      Pain Edu? --      Excl. in GC? --     Constitutional: Alert and oriented. Well appearing and in no acute distress. Cardiovascular: Normal rate, regular rhythm. Grossly  normal heart sounds.  Good peripheral circulation. Respiratory: Normal respiratory effort.  No retractions. Lungs CTAB. Gastrointestinal: Soft and nontender. No distention. No abdominal bruits. No CVA tenderness. Musculoskeletal: No lower extremity tenderness nor edema.  No joint effusions. Neurologic:  Normal speech and language. No gross focal neurologic deficits are appreciated. No gait instability. Skin:  Skin is warm, dry and intact. Vesicle lesions on erythematous base. Psychiatric: Mood and affect are normal. Speech and behavior are normal.  ____________________________________________   LABS (all labs ordered are listed, but only abnormal results are  displayed)  Labs Reviewed - No data to display ____________________________________________  EKG   ____________________________________________  RADIOLOGY  No results found.  ____________________________________________   PROCEDURES  Procedure(s) performed: None  Procedures  Critical Care performed: No  ____________________________________________   INITIAL IMPRESSION / ASSESSMENT AND PLAN / ED COURSE  Pertinent labs & imaging results that were available during my care of the patient were reviewed by me and considered in my medical decision making (see chart for details).  Contact dermatitis. Patient given discharge care instructions. Patient given topical steroidal cream and Benadryl. Patient advised to use store breastmilk until she finished medications.      ____________________________________________   FINAL CLINICAL IMPRESSION(S) / ED DIAGNOSES  Final diagnoses:  Contact dermatitis due to plants, except food, unspecified contact dermatitis type      NEW MEDICATIONS STARTED DURING THIS VISIT:  New Prescriptions   DIPHENHYDRAMINE (BENADRYL) 25 MG TABLET    Take 1 tablet (25 mg total) by mouth every 6 (six) hours as needed.   HYDROCORTISONE VALERATE OINTMENT (WESTCORT) 0.2 %    Apply to affected area daily     Note:  This document was prepared using Dragon voice recognition software and may include unintentional dictation errors.    Joni Reining, PA-C 05/17/17 1416    Minna Antis, MD 05/17/17 [phone]

## 2017-05-17 NOTE — ED Notes (Signed)
See triage note states she developed rash couple of days ago   Positive itching

## 2017-05-17 NOTE — ED Triage Notes (Signed)
Itching rash throughout, x3 days

## 2017-10-19 ENCOUNTER — Ambulatory Visit (INDEPENDENT_AMBULATORY_CARE_PROVIDER_SITE_OTHER): Payer: Medicaid Other | Admitting: Advanced Practice Midwife

## 2017-10-19 ENCOUNTER — Encounter: Payer: Self-pay | Admitting: Advanced Practice Midwife

## 2017-10-19 VITALS — BP 112/52 | HR 84 | Ht 64.0 in | Wt 147.0 lb

## 2017-10-19 DIAGNOSIS — N898 Other specified noninflammatory disorders of vagina: Secondary | ICD-10-CM

## 2017-10-19 DIAGNOSIS — Z113 Encounter for screening for infections with a predominantly sexual mode of transmission: Secondary | ICD-10-CM

## 2017-10-19 NOTE — Progress Notes (Signed)
S: The patient is here today with concerns for possible exposure to STDs. She has had some vaginal discharge and odor for the past 2 weeks. She denies itching, irritation or burning. Her husband has also had a discharge and some blood in his urine. She says he plans to be tested today. She states he has been unfaithful before and she is concerned that he may have been again.   O: Vital Signs: BP (!) 112/52 (BP Location: Left Arm, Patient Position: Sitting, Cuff Size: Normal)   Pulse 84   Ht 5\' 4"  (1.626 m)   Wt 147 lb (66.7 kg)   BMI 25.23 kg/m  Constitutional: Well nourished, well developed female in no acute distress.  HEENT: normal Skin: Warm and dry.  Cardiovascular: Regular rate and rhythm.    Respiratory: Clear to auscultation bilateral. Normal respiratory effort Psych: Alert and Oriented x3. No memory deficits. Normal mood and affect.  MS: normal gait, normal bilateral lower extremity ROM/strength/stability.  Pelvic exam:  is not limited by body habitus EGBUS: within normal limits Vagina: within normal limits and with normal mucosa, scant thin white discharge Cervix: cervical mucous present  Wet prep negative for trich, clue cells or yeast, positive whiff  A: 26 yo female with possible exposure to STDs  P: NuSwab for STDs Return to office PRN and for annual exam  Tresea MallJane Julianne Chamberlin, CNM

## 2017-10-23 LAB — CHLAMYDIA/GONOCOCCUS/TRICHOMONAS, NAA
Chlamydia by NAA: NEGATIVE
Gonococcus by NAA: POSITIVE — AB
Trich vag by NAA: NEGATIVE

## 2017-10-24 ENCOUNTER — Other Ambulatory Visit: Payer: Self-pay | Admitting: Advanced Practice Midwife

## 2017-10-24 DIAGNOSIS — A5403 Gonococcal cervicitis, unspecified: Secondary | ICD-10-CM

## 2017-10-24 MED ORDER — AZITHROMYCIN 500 MG PO TABS: 1000.0000 mg | ORAL_TABLET | Freq: Once | ORAL | 0 refills | Status: AC

## 2017-10-24 MED ORDER — CEFTRIAXONE SODIUM 250 MG IJ SOLR
250.0000 mg | Freq: Once | INTRAMUSCULAR | Status: DC
  Filled 2017-10-24: qty 250

## 2017-10-24 NOTE — Progress Notes (Signed)
Patient seen on 10/19/2017 for STD testing. Positive Gonorrhea result 10/23/2017 on [facility]. Rx sent on 10/24/2017. Patient notified on 10/24/2017. Communicable disease reporting done on 10/24/2017.   Betty MallJane Tamanika Heiney, CNM

## 2017-10-25 ENCOUNTER — Ambulatory Visit (INDEPENDENT_AMBULATORY_CARE_PROVIDER_SITE_OTHER): Payer: Medicaid Other

## 2017-10-25 DIAGNOSIS — A549 Gonococcal infection, unspecified: Secondary | ICD-10-CM

## 2017-10-25 MED ORDER — LIDOCAINE HCL (PF) 1 % IJ SOLN
0.9000 mL | Freq: Once | INTRAMUSCULAR | Status: AC
  Administered 2017-10-25: 0.9 mL

## 2017-10-25 MED ORDER — CEFTRIAXONE SODIUM 250 MG IJ SOLR
250.0000 mg | Freq: Once | INTRAMUSCULAR | Status: AC
  Administered 2017-10-25: 250 mg via INTRAMUSCULAR

## 2017-10-25 NOTE — Progress Notes (Signed)
Pt here for inj of Ceftriaxone 250mg  which was reconstituted c 0.269ml 1% xylocaine -ZOX#[id number]-lot#[id number]; exp date:07/2021

## 2018-02-01 IMAGING — MR MR HEAD W/O CM
11 series · 48 of 48 positions shown · non-contrast
Comparison: CT HEAD November 26, 2010 and MRI of the head May 17, 2007

CLINICAL DATA: Gradual onset headache for 2 days, now severe. Sinus
pressure. Twenty-seven weeks pregnant.

EXAM:
MRI HEAD WITHOUT CONTRAST
MRV HEAD WITHOUT CONTRAST
TECHNIQUE: Multiplanar, multiecho pulse sequences of the brain and surrounding
structures were obtained without intravenous contrast. Angiographic
images of the intracranial venous structures were obtained using MRV
technique without intravenous contrast.

[Series 2: T1 · sagittal · 5.0mm · 0.45mm/px · 2 of 27 slices shown (1 of 2)]
[im 1/27]
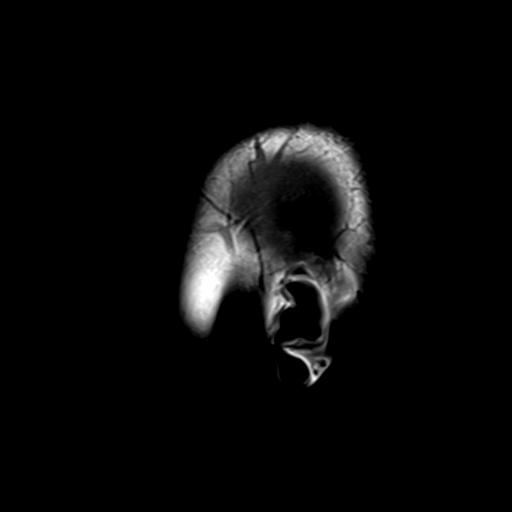
[im 27/27]
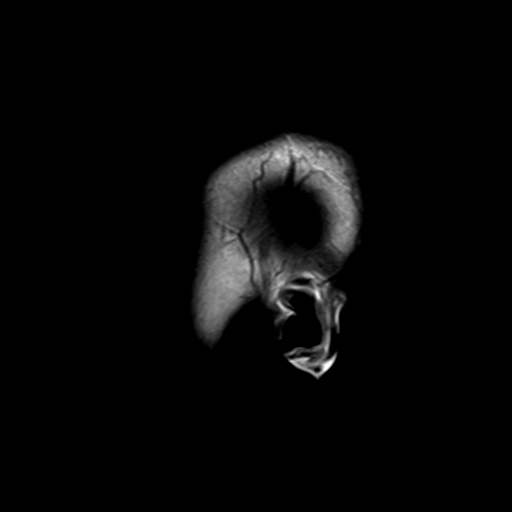

[Series 4: DWI · axial · 3.0mm · 1.80mm/px · z∈[-57,+102]mm · 6 of 54 slices shown (1 of 4)]
[im 1/54]
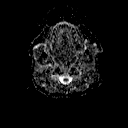
[im 11/54]
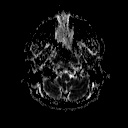
[im 22/54]
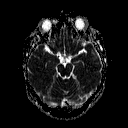
[im 32/54]
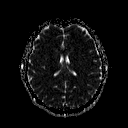
[im 43/54]
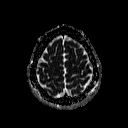
[im 54/54]
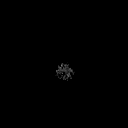

[Series 6: DWI · coronal · 3.0mm · 1.80mm/px · 5 of 45 slices shown (2 of 4)]
[im 1/45]
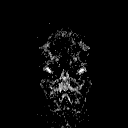
[im 12/45]
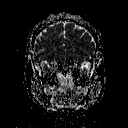
[im 23/45]
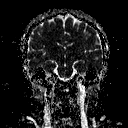
[im 34/45]
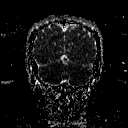
[im 45/45]
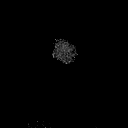

[Series 7: (id) · coronal · 3.0mm · 0.98mm/px · 9 of 85 slices shown]
[im 1/85]
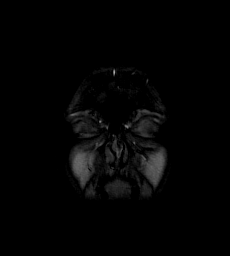
[im 11/85]
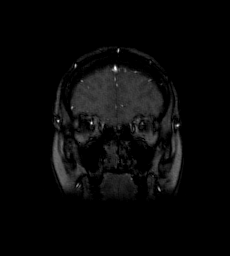
[im 22/85]
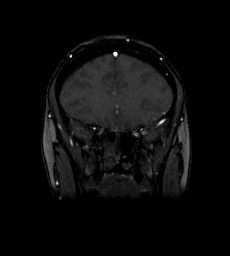
[im 32/85]
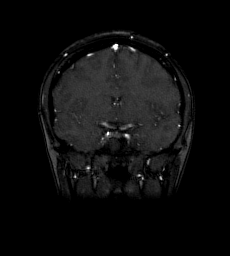
[im 43/85]
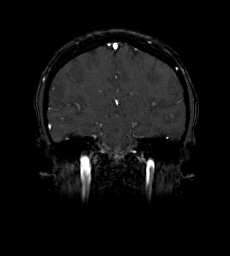
[im 53/85]
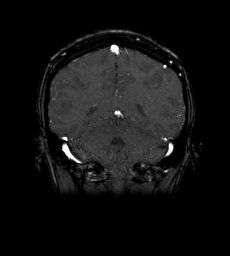
[im 64/85]
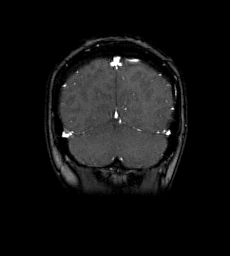
[im 74/85]
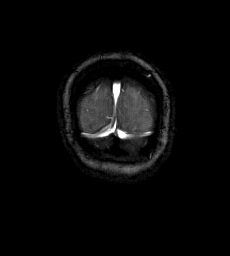
[im 85/85]
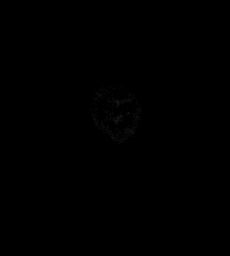

[Series 12: T2 · axial · 5.0mm · 0.60mm/px · z∈[-51,+99]mm · 2 of 24 slices shown (1 of 3)]
[im 1/24]
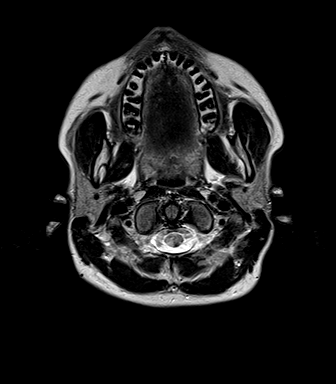
[im 24/24]
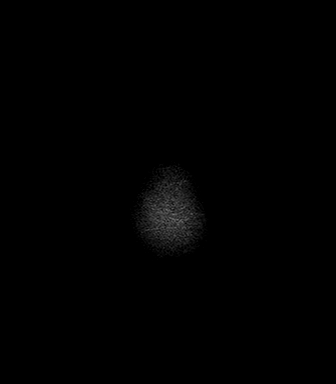

[Series 13: FLAIR · axial · 5.0mm · 0.45mm/px · z∈[-51,+99]mm · 2 of 24 slices shown]
[im 1/24]
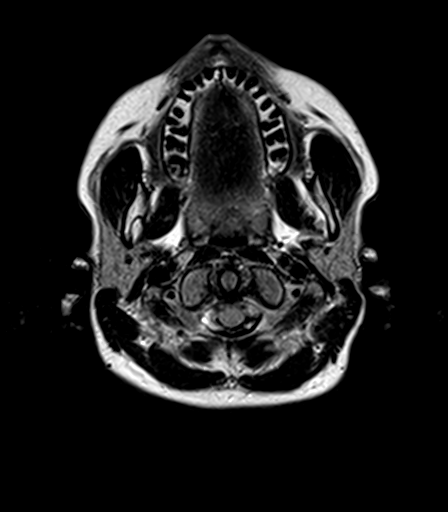
[im 24/24]
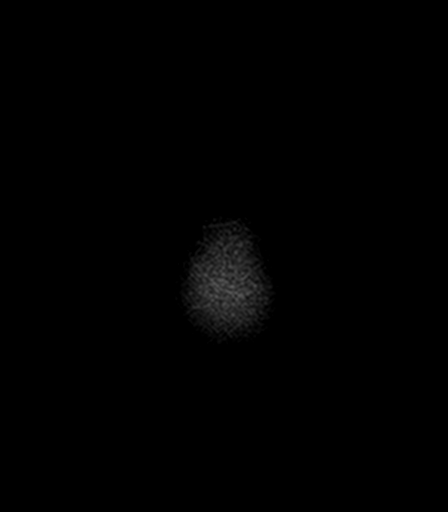

[Series 14: T2 · axial · 5.0mm · 0.45mm/px · z∈[-51,+99]mm · 2 of 24 slices shown (2 of 3)]
[im 1/24]
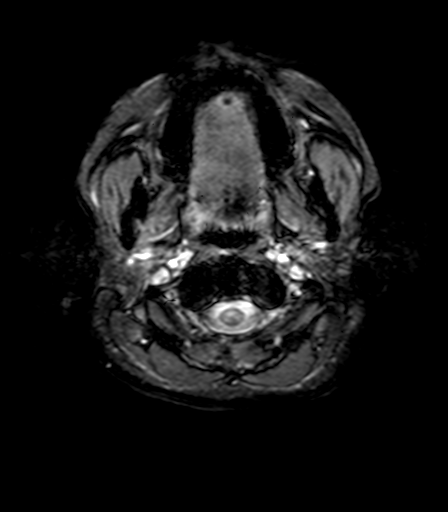
[im 24/24]
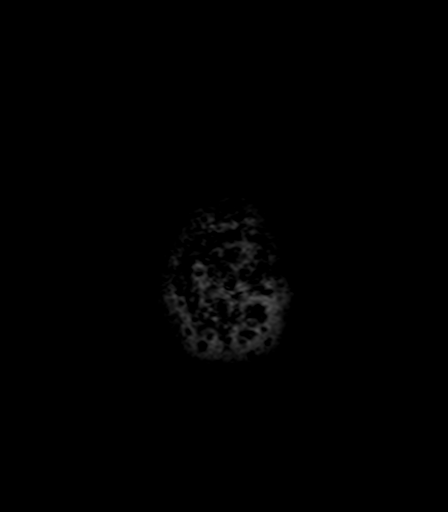

[Series 15: T1 · axial · 3.0mm · 1.00mm/px · z∈[-64,+113]mm · 6 of 60 slices shown (2 of 2)]
[im 1/60]
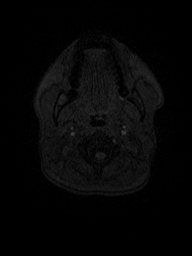
[im 12/60]
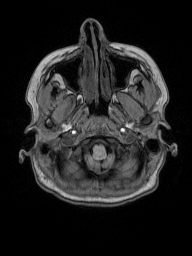
[im 24/60]
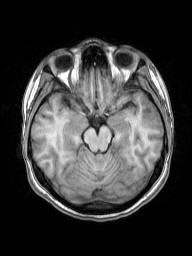
[im 36/60]
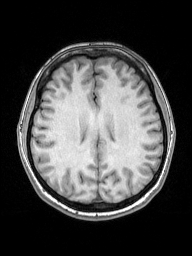
[im 48/60]
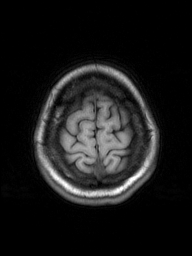
[im 60/60]
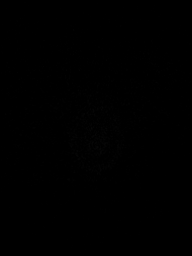

[Series 16: T2 · coronal · 5.0mm · 0.49mm/px · 3 of 27 slices shown (3 of 3)]
[im 1/27]
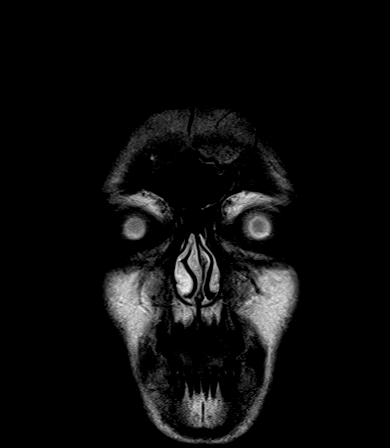
[im 14/27]
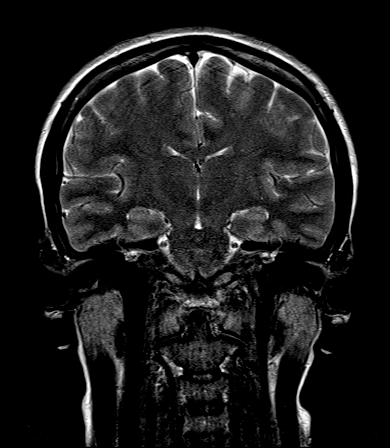
[im 27/27]
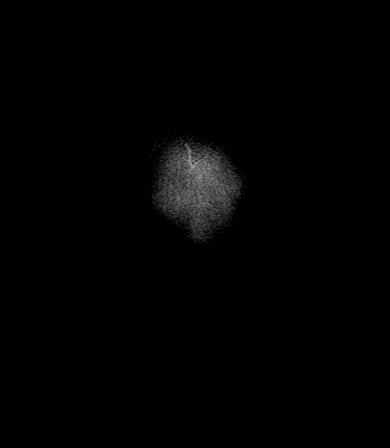

[Series 100: DWI · axial · 3.0mm · 1.80mm/px · z∈[-54,+105]mm · 6 of 54 slices shown (3 of 4)]
[im 1/54]
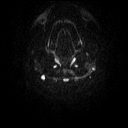
[im 11/54]
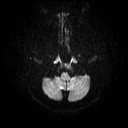
[im 22/54]
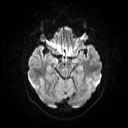
[im 32/54]
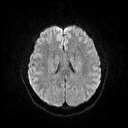
[im 43/54]
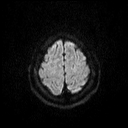
[im 54/54]
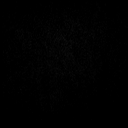

[Series 101: DWI · coronal · 3.0mm · 1.80mm/px · 5 of 44 slices shown (4 of 4)]
[im 1/44]
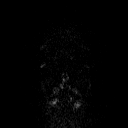
[im 11/44]
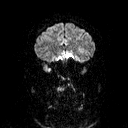
[im 22/44]
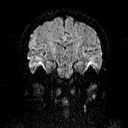
[im 33/44]
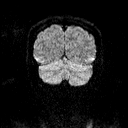
[im 44/44]
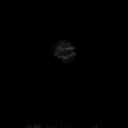

[48 of 48 positions shown; findings below may reference images not displayed]

FINDINGS: MRI HEAD:

BRAIN: No reduced diffusion to suggest acute ischemia. No
susceptibility artifact to suggest hemorrhage. The ventricles and
sulci are normal for patient's age. No suspicious parenchymal
signal, masses or mass effect. No abnormal extra-axial fluid
collections. No extra-axial masses though, contrast enhanced
sequences would be more sensitive.

VASCULAR: Normal major intracranial vascular flow voids present at
skull base.

SKULL AND UPPER CERVICAL SPINE: No abnormal sellar expansion. No
suspicious calvarial bone marrow signal. Craniocervical junction
maintained.

SINUSES/ORBITS: Soft tissue effaces the RIGHT sphenoid sinus. Mild
paranasal sinus mucosal thickening. The included ocular globes and
orbital contents are non-suspicious.

OTHER: None.

MRV HEAD:

Normal flow related enhancement within the superior sagittal sinus,
torcula of the [place], bilateral transverse, sigmoid sinuses and
included internal jugular veins. Normal flow related enhancement of
the internal cerebral veins.
IMPRESSION: Normal MRI head.

Severe sphenoid sinusitis.

Normal MRV head.

## 2018-02-28 ENCOUNTER — Emergency Department
Admission: EM | Admit: 2018-02-28 | Discharge: 2018-02-28 | Disposition: A | Discharge status: Home / Self Care | Payer: Medicaid Other | Attending: Emergency Medicine | Admitting: Emergency Medicine

## 2018-02-28 ENCOUNTER — Encounter: Payer: Self-pay | Admitting: Emergency Medicine

## 2018-02-28 DIAGNOSIS — R51 Headache: Secondary | ICD-10-CM | POA: Diagnosis not present

## 2018-02-28 DIAGNOSIS — F439 Reaction to severe stress, unspecified: Secondary | ICD-10-CM

## 2018-02-28 DIAGNOSIS — M549 Dorsalgia, unspecified: Secondary | ICD-10-CM | POA: Diagnosis present

## 2018-02-28 DIAGNOSIS — R519 Headache, unspecified: Secondary | ICD-10-CM

## 2018-02-28 DIAGNOSIS — F17210 Nicotine dependence, cigarettes, uncomplicated: Secondary | ICD-10-CM | POA: Insufficient documentation

## 2018-02-28 DIAGNOSIS — F430 Acute stress reaction: Secondary | ICD-10-CM | POA: Insufficient documentation

## 2018-02-28 LAB — COMPREHENSIVE METABOLIC PANEL
ALBUMIN: 4.2 g/dL (ref 3.5–5.0)
ALK PHOS: 97 U/L (ref 38–126)
ALT: 23 U/L (ref 14–54)
ANION GAP: 6 (ref 5–15)
AST: 24 U/L (ref 15–41)
BUN: 10 mg/dL (ref 6–20)
CO2: 28 mmol/L (ref 22–32)
Calcium: 8.5 mg/dL — ABNORMAL LOW (ref 8.9–10.3)
Chloride: 100 mmol/L — ABNORMAL LOW (ref 101–111)
Creatinine, Ser: 0.77 mg/dL (ref 0.44–1.00)
GFR calc Af Amer: >60 mL/min (ref >60)
GFR calc non Af Amer: >60 mL/min (ref >60)
GLUCOSE: 132 mg/dL — AB (ref 65–99)
POTASSIUM: 3.4 mmol/L — AB (ref 3.5–5.1)
SODIUM: 134 mmol/L — AB (ref 135–145)
Total Bilirubin: 0.6 mg/dL (ref 0.3–1.2)
Total Protein: 7.4 g/dL (ref 6.5–8.1)

## 2018-02-28 LAB — CBC
HEMATOCRIT: 37 % (ref 35.0–47.0)
HEMOGLOBIN: 13 g/dL (ref 12.0–16.0)
MCH: 32.6 pg (ref 26.0–34.0)
MCHC: 35.1 g/dL (ref 32.0–36.0)
MCV: 92.8 fL (ref 80.0–100.0)
Platelets: 197 10*3/uL (ref 150–440)
RBC: 3.98 MIL/uL (ref 3.80–5.20)
RDW: 12.4 % (ref 11.5–14.5)
WBC: 8.6 10*3/uL (ref 3.6–11.0)

## 2018-02-28 LAB — URINALYSIS, COMPLETE (UACMP) WITH MICROSCOPIC
BACTERIA UA: NONE SEEN
BILIRUBIN URINE: NEGATIVE
Glucose, UA: NEGATIVE mg/dL
Ketones, ur: NEGATIVE mg/dL
Leukocytes, UA: NEGATIVE
Nitrite: NEGATIVE
Protein, ur: NEGATIVE mg/dL
SPECIFIC GRAVITY, URINE: 1.01 (ref 1.005–1.030)
pH: 6 (ref 5.0–8.0)

## 2018-02-28 LAB — LIPASE, BLOOD: Lipase: 22 U/L (ref 11–51)

## 2018-02-28 LAB — POCT PREGNANCY, URINE: PREG TEST UR: NEGATIVE

## 2018-02-28 MED ORDER — SODIUM CHLORIDE 0.9 % IV BOLUS
1000.0000 mL | Freq: Once | INTRAVENOUS | Status: AC
  Administered 2018-02-28: 1000 mL via INTRAVENOUS

## 2018-02-28 MED ORDER — KETOROLAC TROMETHAMINE 30 MG/ML IJ SOLN
15.0000 mg | Freq: Once | INTRAMUSCULAR | Status: AC
  Administered 2018-02-28: 15 mg via INTRAVENOUS
  Filled 2018-02-28: qty 1

## 2018-02-28 MED ORDER — ONDANSETRON HCL 4 MG/2ML IJ SOLN
4.0000 mg | Freq: Once | INTRAMUSCULAR | Status: AC
  Administered 2018-02-28: 4 mg via INTRAVENOUS
  Filled 2018-02-28: qty 2

## 2018-02-28 NOTE — ED Notes (Signed)
Pt verbalizes d./c understanding and follow up. Pt in NAD, VSS, pt unable to sign due to signaturte pad malfnx

## 2018-02-28 NOTE — ED Notes (Signed)
Pt c/o generalized body aches, head ache and back pain. Increased stress per pt

## 2018-02-28 NOTE — ED Triage Notes (Signed)
Pt comes into the ED via POV c/o multiple c/o headache, back pain, lower abdominal pain, and overall body aches.  Patient states she is concerned she may also have a UTI.  Denies any fever, N/V/D.  Patient in NAD at this time with even and unlabored respirations. Denies h/o kidney stones or urinary problems.

## 2018-02-28 NOTE — Discharge Instructions (Addendum)
Return to the emergency room for worsening abdominal pain, back pain, fever, stiff neck, worsening headache, numbness, weakness or if you feel worse in any way.

## 2018-02-28 NOTE — ED Provider Notes (Addendum)
[facility] Emergency Department Provider Note  ____________________________________________   I have reviewed the triage vital signs and the nursing notes. Where available I have reviewed prior notes and, if possible and indicated, outside hospital notes.    HISTORY  Chief Complaint Back Pain and Headache    HPI Betty Powell is a 27 y.o. female with a history of chronic recurrent migraines, who used to have a seizure disorder but no longer is supposed to take seizure medications she states, chronic endometrial issues, presents today feeling bad all over.  She had low back pain, she had some cramping in her abdomen "like I was going to get up.",  And then began to have a migraine which is normal for her gradual onset, no significant vomiting or diarrhea.  No fevers no chills no stiff neck.  She states that she is under a great deal of stress when asked her why she begins to cry and she tells me that she is getting a divorce and had to take a restraining order against her husband.  She states that this has been causing her a great deal of stress she is also breast-feeding and up all night.  She states that she think stress is bringing out her symptoms.  She denies any change in vision or hearing focal neurologic deficit, not worse headache of life, had a recent negative MRI for these exact headaches although she states his headache is nowhere near as bad as that one.  She has no radicular symptoms, no numbness no weakness no incontinence of bowel or bladder with her mild low back pain, and she does not have any abdominal pain at this time. Has not tried anything at home.  Patient states that she is safe at home she has not been sexually active with her husband since December, she has not been sexually active with anyone since December she denies vaginal discharge she denies being abused. She has no SI no HI feels safe at home.  Past Medical History:  Diagnosis Date   . Anxiety   . Seizures [facility])     Patient Active Problem List   Diagnosis Date Noted  . Closed fracture of great toe of right foot 05/10/2017  . Substance abuse (HCC) 01/08/2017    Past Surgical History:  Procedure Laterality Date  . NO PAST SURGERIES      Prior to Admission medications   Medication Sig Start Date End Date Taking? Authorizing Provider  buprenorphine (SUBUTEX) 8 MG SUBL SL tablet DISSOLVE 1 T UNDER THE TONGUE BID FOR 7 DAYS 01/18/17   [provider]    Allergies Patient has no known allergies.  Family History  Problem Relation Age of Onset  . Cancer Mother   . Diabetes Father   . Heart disease Father   . Cancer Paternal Grandmother     Social History Social History   Tobacco Use  . Smoking status: Current Every Day Smoker    Packs/day: 0.50    Types: E-cigarettes  . Smokeless tobacco: Never Used  Substance Use Topics  . Alcohol use: No  . Drug use: No    Review of Systems Constitutional: No fever/chills Eyes: No visual changes. ENT: No sore throat. No stiff neck no neck pain Cardiovascular: Denies chest pain. Respiratory: Denies shortness of breath. Gastrointestinal:   no vomiting.  No diarrhea.  No constipation. Genitourinary: Negative for dysuria. Musculoskeletal: Negative lower extremity swelling Skin: Negative for rash. Neurological: Negative for severe headaches, focal weakness  or numbness.   ____________________________________________   PHYSICAL EXAM:  VITAL SIGNS: ED Triage Vitals [02/28/18 1341]  Enc Vitals Group     BP 107/68     Pulse Rate 85     Resp 17     Temp 98.4 F (36.9 C)     Temp Source Oral     SpO2 100 %     Weight 147 lb (66.7 kg)     Height 5\' 4"  (1.626 m)     Head Circumference      Peak Flow      Pain Score 7     Pain Loc      Pain Edu?      Excl. in GC?     Constitutional: Alert and oriented. Well appearing and in no acute distress.  Patient anxious and does begin to cry when she  discusses her family life. Eyes: Conjunctivae are normal Head: Atraumatic HEENT: No congestion/rhinnorhea. Mucous membranes are moist.  Oropharynx non-erythematous Neck:   Nontender with no meningismus, no masses, no stridor Cardiovascular: Normal rate, regular rhythm. Grossly normal heart sounds.  Good peripheral circulation. Respiratory: Normal respiratory effort.  No retractions. Lungs CTAB. Abdominal: Soft and nontender. No distention. No guarding no rebound Back:  There is no focal tenderness or step off.  there is no midline tenderness there are no lesions noted. there is no CVA tenderness Musculoskeletal: No lower extremity tenderness, no upper extremity tenderness. No joint effusions, no DVT signs strong distal pulses no edema Neurologic:  Normal speech and language. No gross focal neurologic deficits are appreciated.  Skin:  Skin is warm, dry and intact. No rash noted. Psychiatric: Mood and affect are normal. Speech and behavior are normal.  ____________________________________________   LABS (all labs ordered are listed, but only abnormal results are displayed)  Labs Reviewed  COMPREHENSIVE METABOLIC PANEL - Abnormal; Notable for the following components:      Result Value   Sodium 134 (*)    Potassium 3.4 (*)    Chloride 100 (*)    Glucose, Bld 132 (*)    Calcium 8.5 (*)    All other components within normal limits  URINALYSIS, COMPLETE (UACMP) WITH MICROSCOPIC - Abnormal; Notable for the following components:   Color, Urine STRAW (*)    APPearance CLEAR (*)    Hgb urine dipstick SMALL (*)    All other components within normal limits  LIPASE, BLOOD  CBC  POCT PREGNANCY, URINE  POC URINE PREG, ED    Pertinent labs  results that were available during my care of the patient were reviewed by me and considered in my medical decision making (see chart for details). ____________________________________________  EKG  I personally interpreted any EKGs ordered by me or  triage  ____________________________________________  RADIOLOGY  Pertinent labs & imaging results that were available during my care of the patient were reviewed by me and considered in my medical decision making (see chart for details). If possible, patient and/or family made aware of any abnormal findings.  No results found. ____________________________________________    PROCEDURES  Procedure(s) performed: None  Procedures  Critical Care performed: None  ____________________________________________   INITIAL IMPRESSION / ASSESSMENT AND PLAN / ED COURSE  Pertinent labs & imaging results that were available during my care of the patient were reviewed by me and considered in my medical decision making (see chart for details).  Very well-appearing woman no reproducible abdominal pain she does have chronic endometrial pain at this time, her abdomen is  completely benign and she is not complaining of abdominal pain.  Her back is nontender to any significant degree she states that the paraspinal muscles were little sore, she is in no acute distress otherwise, her headache is a routine headache, for her, not worse headache of life gradual onset etc.  She feels that with IV fluid and some medication for headache she will feel 100% better.  Work-up was otherwise completely normal including vital signs, we will treat her symptomatically.  Considering the patient's symptoms, medical history, and physical examination today, I have low suspicion for cholecystitis or biliary pathology, pancreatitis, perforation or bowel obstruction, hernia, intra-abdominal abscess, AAA or dissection, volvulus or intussusception, mesenteric ischemia, ischemic gut, pyelonephritis or appendicitis.    Pt remains at neurologic baseline. At this time, there is nothing to suggest or support the diagnosis of subarachnoid hemorrhage, aneurysmal event, meningitis, tumor or mass, cavernous thrombosis, encephalitis,  ischemic stroke, pseudotumor cerebri, glaucoma, temporal arteritis, or any other acute intracrania/neurological process. Extensive return precautions including but not limited to any new or worrisome symptoms such as worsening of, or change in, headache, any neurological symptoms, fever etc.. Natural disease course discussed with patient. The need for follow-up and all of my customary return precautions have been discussed as well.   ----------------------------------------- 4:26 PM on 02/28/2018 -----------------------------------------  Recent was able to sleep here when she woke up all of her pain all of her symptoms were gone she feels much better and much relief she thinks it is mostly stress.  Tertiary exam is quite unremarkable in all respects.  We will discharge with close outpatient follow-up and return precautions given and understood.  She is neurologically intact at baseline at discharge with no abdominal pain or tenderness, no back pain and no other complaints   ____________________________________________   FINAL CLINICAL IMPRESSION(S) / ED DIAGNOSES  Final diagnoses:  None      This chart was dictated using voice recognition software.  Despite best efforts to proofread,  errors can occur which can change meaning.      Jeanmarie Plant, MD 02/28/18 1523    Jeanmarie Plant, MD 02/28/18 1627    Jeanmarie Plant, MD 02/28/18 [phone]

## 2018-08-05 ENCOUNTER — Ambulatory Visit: Payer: Medicaid Other | Admitting: Obstetrics and Gynecology

## 2018-08-29 ENCOUNTER — Other Ambulatory Visit: Payer: Self-pay | Admitting: Obstetrics and Gynecology

## 2018-08-29 NOTE — Telephone Encounter (Signed)
Please advise 

## 2018-11-14 ENCOUNTER — Emergency Department
Admission: EM | Admit: 2018-11-14 | Discharge: 2018-11-14 | Disposition: A | Discharge status: Home / Self Care | Payer: Medicaid Other | Attending: Emergency Medicine | Admitting: Emergency Medicine

## 2018-11-14 ENCOUNTER — Other Ambulatory Visit: Payer: Self-pay

## 2018-11-14 ENCOUNTER — Encounter: Payer: Self-pay | Admitting: Emergency Medicine

## 2018-11-14 DIAGNOSIS — R350 Frequency of micturition: Secondary | ICD-10-CM | POA: Insufficient documentation

## 2018-11-14 DIAGNOSIS — X500XXA Overexertion from strenuous movement or load, initial encounter: Secondary | ICD-10-CM | POA: Insufficient documentation

## 2018-11-14 DIAGNOSIS — N390 Urinary tract infection, site not specified: Secondary | ICD-10-CM | POA: Insufficient documentation

## 2018-11-14 DIAGNOSIS — Y9289 Other specified places as the place of occurrence of the external cause: Secondary | ICD-10-CM | POA: Insufficient documentation

## 2018-11-14 DIAGNOSIS — Y9389 Activity, other specified: Secondary | ICD-10-CM | POA: Insufficient documentation

## 2018-11-14 DIAGNOSIS — F17210 Nicotine dependence, cigarettes, uncomplicated: Secondary | ICD-10-CM | POA: Insufficient documentation

## 2018-11-14 DIAGNOSIS — Z79899 Other long term (current) drug therapy: Secondary | ICD-10-CM | POA: Insufficient documentation

## 2018-11-14 DIAGNOSIS — S39012A Strain of muscle, fascia and tendon of lower back, initial encounter: Secondary | ICD-10-CM | POA: Insufficient documentation

## 2018-11-14 DIAGNOSIS — Y990 Civilian activity done for income or pay: Secondary | ICD-10-CM | POA: Insufficient documentation

## 2018-11-14 HISTORY — DX: Endometriosis, unspecified: N80.9

## 2018-11-14 HISTORY — DX: Epilepsy, unspecified, not intractable, without status epilepticus: G40.909

## 2018-11-14 LAB — URINALYSIS, COMPLETE (UACMP) WITH MICROSCOPIC
Bilirubin Urine: NEGATIVE
Glucose, UA: NEGATIVE mg/dL
Hgb urine dipstick: NEGATIVE
Ketones, ur: NEGATIVE mg/dL
Leukocytes, UA: NEGATIVE
Nitrite: NEGATIVE
Protein, ur: NEGATIVE mg/dL
Specific Gravity, Urine: 1.019 (ref 1.005–1.030)
pH: 6 (ref 5.0–8.0)

## 2018-11-14 LAB — POCT PREGNANCY, URINE: Preg Test, Ur: NEGATIVE

## 2018-11-14 MED ORDER — NITROFURANTOIN MONOHYD MACRO 100 MG PO CAPS: 100.0000 mg | ORAL_CAPSULE | Freq: Two times a day (BID) | ORAL | 0 refills | Status: AC

## 2018-11-14 MED ORDER — PHENAZOPYRIDINE HCL 200 MG PO TABS: 200.0000 mg | ORAL_TABLET | Freq: Three times a day (TID) | ORAL | 0 refills | Status: DC | PRN

## 2018-11-14 MED ORDER — CYCLOBENZAPRINE HCL 10 MG PO TABS: 10.0000 mg | ORAL_TABLET | Freq: Three times a day (TID) | ORAL | 0 refills | Status: DC | PRN

## 2018-11-14 NOTE — ED Triage Notes (Signed)
Patient states she is sexually active, uses Nexplanon implant, no protection from STD's, denies any vaginal discharge.  Has been treated for GC in the past.

## 2018-11-14 NOTE — ED Provider Notes (Signed)
[facility] Emergency Department Provider Note   ____________________________________________   First MD Initiated Contact with Patient 11/14/18 1425     (approximate)  I have reviewed the triage vital signs and the nursing notes.   HISTORY  Chief Complaint Back Pain and Urinary Frequency    HPI Betty Powell is a 28 y.o. female patient complain of 3 to 4 months of intermittent urinary frequency and urgency.  Patient stated the past week she has noticed her urine is malodorous.  Patient denies vaginal discharge or flank pain.  Patient states she has had some mid back pain which he attributed to repetitive lifting at work.  Patient denies radicular component to her back pain.  Patient denies bladder or bowel dysfunction.  Patient rates her pain currently is 0/10.  No palliative measures for complaint.  Past Medical History:  Diagnosis Date  . Anxiety   . Endometriosis   . Epilepsy (HCC)   . Seizures [facility])     Patient Active Problem List   Diagnosis Date Noted  . Closed fracture of great toe of right foot 05/10/2017  . Substance abuse (HCC) 01/08/2017    Past Surgical History:  Procedure Laterality Date  . NO PAST SURGERIES      Prior to Admission medications   Medication Sig Start Date End Date Taking? Authorizing Provider  buprenorphine (SUBUTEX) 8 MG SUBL SL tablet DISSOLVE 1 T UNDER THE TONGUE BID FOR 7 DAYS 01/18/17   [provider]  cyclobenzaprine (FLEXERIL) 10 MG tablet Take 1 tablet (10 mg total) by mouth 3 (three) times daily as needed. 11/14/18   Joni Reining, PA-C  nitrofurantoin, macrocrystal-monohydrate, (MACROBID) 100 MG capsule TAKE 1 CAPSULE BY MOUTH EVERY 12 HOURS WITH FOOD FOR 7 DAYS 08/29/18   Copland, Helmut Muster B, PA-C  nitrofurantoin, macrocrystal-monohydrate, (MACROBID) 100 MG capsule Take 1 capsule (100 mg total) by mouth 2 (two) times daily for 7 days. 11/14/18 11/21/18  Joni Reining, PA-C  phenazopyridine  (PYRIDIUM) 200 MG tablet Take 1 tablet (200 mg total) by mouth 3 (three) times daily as needed for pain. 11/14/18   Joni Reining, PA-C    Allergies Patient has no known allergies.  Family History  Problem Relation Age of Onset  . Cancer Mother   . Diabetes Father   . Heart disease Father   . Cancer Paternal Grandmother     Social History Social History   Tobacco Use  . Smoking status: Current Every Day Smoker    Packs/day: 0.50    Types: E-cigarettes  . Smokeless tobacco: Never Used  Substance Use Topics  . Alcohol use: Yes  . Drug use: No    Review of Systems Constitutional: No fever/chills Eyes: No visual changes. ENT: No sore throat. Cardiovascular: Denies chest pain. Respiratory: Denies shortness of breath. Gastrointestinal: No abdominal pain.  No nausea, no vomiting.  No diarrhea.  No constipation. Genitourinary: Urinary frequency and urgency.  Malodorous urine. Musculoskeletal: Positive for back pain. Skin: Negative for rash. Neurological: Negative for headaches, focal weakness or numbness. Psychiatric:Anxiety  ____________________________________________   PHYSICAL EXAM:  VITAL SIGNS: ED Triage Vitals  Enc Vitals Group     BP 11/14/18 1419 115/74     Pulse Rate 11/14/18 1419 60     Resp 11/14/18 1419 16     Temp 11/14/18 1419 98.4 F (36.9 C)     Temp Source 11/14/18 1419 Oral     SpO2 11/14/18 1419 100 %  Weight 11/14/18 1420 120 lb (54.4 kg)     Height 11/14/18 1420 5\' 4"  (1.626 m)     Head Circumference --      Peak Flow --      Pain Score 11/14/18 1420 0     Pain Loc --      Pain Edu? --      Excl. in GC? --     Constitutional: Alert and oriented. Well appearing and in no acute distress. Cardiovascular: Normal rate, regular rhythm. Grossly normal heart sounds.  Good peripheral circulation. Respiratory: Normal respiratory effort.  No retractions. Lungs CTAB. Gastrointestinal: Soft and nontender. No distention. No abdominal bruits. No  CVA tenderness. Musculoskeletal: No lower extremity tenderness nor edema.  No joint effusions. Neurologic:  Normal speech and language. No gross focal neurologic deficits are appreciated. No gait instability. Skin:  Skin is warm, dry and intact. No rash noted. Psychiatric: Mood and affect are normal. Speech and behavior are normal.  ____________________________________________  LABS (all labs ordered are listed, but only abnormal results are displayed)  Labs Reviewed  URINALYSIS, COMPLETE (UACMP) WITH MICROSCOPIC - Abnormal; Notable for the following components:      Result Value   Color, Urine YELLOW (*)    APPearance CLEAR (*)    Bacteria, UA FEW (*)    All other components within normal limits  POCT PREGNANCY, URINE  POC URINE PREG, ED   ____________________________________________  EKG   ____________________________________________  RADIOLOGY  ED MD interpretation:    Official radiology report(s): No results found.  ____________________________________________   PROCEDURES  Procedure(s) performed: None  Procedures  Critical Care performed: No  ____________________________________________   INITIAL IMPRESSION / ASSESSMENT AND PLAN / ED COURSE  As part of my medical decision making, I reviewed the following data within the electronic MEDICAL RECORD NUMBER  Patient presented urinary frequency and urgency and malodorous urine intubating for couple of months.  Patient denies vaginal discharge, fever, or flank pain.  P patient urinalysis consistent with urinary tract infection.  Patient given discharge care instruction advised take medication directed.  Patient advised [facility] to have [facility] retested.      ____________________________________________   FINAL CLINICAL IMPRESSION(S) / ED DIAGNOSES  Final diagnoses:  Lower urinary tract infectious disease  Strain of lumbar region, initial encounter     ED Discharge Orders          Ordered    nitrofurantoin, macrocrystal-monohydrate, (MACROBID) 100 MG capsule  2 times daily     11/14/18 1546    phenazopyridine (PYRIDIUM) 200 MG tablet  3 times daily PRN     11/14/18 1546    cyclobenzaprine (FLEXERIL) 10 MG tablet  3 times daily PRN     11/14/18 1546           Note:  This document was prepared using Dragon voice recognition software and may include unintentional dictation errors.    Joni Reining, PA-C 11/14/18 1556    Don Perking, [place], MD 11/17/18 1700

## 2018-11-14 NOTE — ED Triage Notes (Signed)
Patient complaining of back pain with feeling of urinary urgency and "strong smelling urine" off and on X 4 mos.

## 2019-01-09 ENCOUNTER — Ambulatory Visit: Payer: Medicaid Other | Admitting: Obstetrics & Gynecology

## 2019-01-16 ENCOUNTER — Ambulatory Visit: Payer: Medicaid Other | Admitting: Obstetrics & Gynecology

## 2019-02-26 ENCOUNTER — Ambulatory Visit (INDEPENDENT_AMBULATORY_CARE_PROVIDER_SITE_OTHER): Payer: Medicaid Other | Admitting: Obstetrics and Gynecology

## 2019-02-26 ENCOUNTER — Other Ambulatory Visit: Payer: Self-pay

## 2019-02-26 ENCOUNTER — Other Ambulatory Visit (HOSPITAL_COMMUNITY)
Admission: RE | Admit: 2019-02-26 | Discharge: 2019-02-26 | Disposition: A | Discharge status: Home / Self Care | Payer: BLUE CROSS/BLUE SHIELD | Source: Ambulatory Visit | Attending: Obstetrics and Gynecology | Admitting: Obstetrics and Gynecology

## 2019-02-26 ENCOUNTER — Encounter: Payer: Self-pay | Admitting: Obstetrics and Gynecology

## 2019-02-26 VITALS — BP 128/74 | HR 81 | Ht 64.0 in | Wt 118.0 lb

## 2019-02-26 DIAGNOSIS — Z124 Encounter for screening for malignant neoplasm of cervix: Secondary | ICD-10-CM

## 2019-02-26 DIAGNOSIS — Z113 Encounter for screening for infections with a predominantly sexual mode of transmission: Secondary | ICD-10-CM | POA: Diagnosis not present

## 2019-02-26 NOTE — Progress Notes (Signed)
Obstetrics & Gynecology Office Visit   Chief Complaint:  Chief Complaint  Patient presents with  . STD screening    Vaginal discharge    History of Present Illness: Patient is a 28 y.o. [id number] presents for STD testing.  The patient has noted intermenstrual spotting,  has not experienced postcoital bleeding, and does report increased vaginal discharge.  (Nexplanon)   The patient is sexually active. She currently uses nexplanon for contraception (05/10/2017). She never uses condoms or barrier methods to prevent the spread of sexually transmitted infections.  The patient has not been previously transfused or tattooed.    Review of Systems: Review of Systems  Constitutional: Negative.   Gastrointestinal: Negative for abdominal pain.  Genitourinary: Negative.      Past Medical History:  Past Medical History:  Diagnosis Date  . Anxiety   . Endometriosis   . Epilepsy (HCC)   . Seizures (HCC)     Past Surgical History:  Past Surgical History:  Procedure Laterality Date  . NO PAST SURGERIES      Gynecologic History: Patient's last menstrual period was 02/14/2019.  Obstetric History: [id number]  Family History:  Family History  Problem Relation Age of Onset  . Cancer Mother   . Diabetes Father   . Heart disease Father   . Cancer Paternal Grandmother     Social History:  Social History   Socioeconomic History  . Marital status: Married    Spouse name: Not on file  . Number of children: Not on file  . Years of education: Not on file  . Highest education level: Not on file  Occupational History  . Not on file  Social Needs  . Financial resource strain: Not on file  . Food insecurity:    Worry: Not on file    Inability: Not on file  . Transportation needs:    Medical: Not on file    Non-medical: Not on file  Tobacco Use  . Smoking status: Current Every Day Smoker    Packs/day: 0.50    Types: E-cigarettes  . Smokeless tobacco: Never Used  Substance and  Sexual Activity  . Alcohol use: Yes  . Drug use: No  . Sexual activity: Yes    Birth control/protection: Inserts  Lifestyle  . Physical activity:    Days per week: Not on file    Minutes per session: Not on file  . Stress: Not on file  Relationships  . Social connections:    Talks on phone: Not on file    Gets together: Not on file    Attends religious service: Not on file    Active member of club or organization: Not on file    Attends meetings of clubs or organizations: Not on file    Relationship status: Not on file  . Intimate partner violence:    Fear of current or ex partner: Not on file    Emotionally abused: Not on file    Physically abused: Not on file    Forced sexual activity: Not on file  Other Topics Concern  . 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 Authorizing Provider  buprenorphine (SUBUTEX) 8 MG SUBL SL tablet DISSOLVE 1 T UNDER THE TONGUE BID FOR 7 DAYS 01/18/17   [provider]  cyclobenzaprine (FLEXERIL) 10 MG tablet  Take 1 tablet (10 mg total) by mouth 3 (three) times daily as needed. 11/14/18   Joni ReiningSmith, Ronald K, PA-C  nitrofurantoin, macrocrystal-monohydrate, (MACROBID) 100 MG capsule TAKE 1 CAPSULE BY MOUTH EVERY 12 HOURS WITH FOOD FOR 7 DAYS 08/29/18   Copland, Ilona SorrelAlicia B, PA-C  phenazopyridine (PYRIDIUM) 200 MG tablet Take 1 tablet (200 mg total) by mouth 3 (three) times daily as needed for pain. 11/14/18   Joni ReiningSmith, Ronald K, PA-C    Physical Exam Vitals:  Vitals:   02/26/19 1419  BP: 128/74  Pulse: 81   Patient's last menstrual period was 02/14/2019.  General: NAD, well nourished, appears states age HEENT: normocephalic, anicteric Pulmonary: No increased work of breathing  Genitourinary:  External: Normal external female genitalia.  Normal urethral meatus, normal Bartholin's and  Skene's glands.    Vagina: Normal vaginal mucosa, no evidence of prolapse.    Cervix: Grossly normal in appearance, no bleeding  Uterus: Non-enlarged, mobile, normal contour.  No CMT  Adnexa: ovaries non-enlarged, no adnexal masses  Rectal: deferred  Lymphatic: no evidence of inguinal lymphadenopathy Extremities: no edema, erythema, or tenderness Neurologic: Grossly intact Psychiatric: mood appropriate, affect full  Female chaperone present for pelvic  portions of the physical exam  Wet Prep: PH: 4.5 Clue Cells: Negative Fungal elements: Negative Trichomonas: Negative   Assessment: 28 y.o. [id number] STI check  Plan: Problem List Items Addressed This Visit    None    Visit Diagnoses    Routine screening for STI (sexually transmitted infection)    -  Primary   Relevant Orders   HEP, RPR, HIV Panel   Cytology - PAP   Screening for malignant neoplasm of cervix       Relevant Orders   Cytology - PAP     1) STI screening, vaginal irritation - wet mount negative, reassurance provided.  Pap smear obtained as well as GC/CT/Trichomonas.  HIV, RPR, Hep B drawn.  2) She understands that Nexplanon is an extremely effective option for contraception, with failure rate of <1%, but in an of itself does not protect against STI  3) Return in about 1 year (around 02/26/2020) for annual.    Vena AustriaAndreas Lorrie Strauch, MD, Merlinda FrederickFACOG [facility], [facility] 02/26/2019, 2:38 PM

## 2019-02-27 LAB — HEP, RPR, HIV PANEL
HIV Screen 4th Generation wRfx: NONREACTIVE
Hepatitis B Surface Ag: NEGATIVE
RPR Ser Ql: NONREACTIVE

## 2019-03-04 LAB — CYTOLOGY - PAP
Chlamydia: NEGATIVE
Diagnosis: UNDETERMINED — AB
HPV: DETECTED — AB
Neisseria Gonorrhea: NEGATIVE
Trichomonas: POSITIVE — AB

## 2019-03-05 ENCOUNTER — Other Ambulatory Visit: Payer: Self-pay | Admitting: Obstetrics and Gynecology

## 2019-03-05 MED ORDER — METRONIDAZOLE 500 MG PO TABS: 500.0000 mg | ORAL_TABLET | Freq: Two times a day (BID) | ORAL | 1 refills | Status: AC

## 2019-03-07 ENCOUNTER — Telehealth: Payer: Self-pay | Admitting: Obstetrics and Gynecology

## 2019-03-07 NOTE — Telephone Encounter (Signed)
Patient is schedule 04/02/19 at 2:30 with AMS

## 2019-03-07 NOTE — Telephone Encounter (Signed)
-----   Message from Vena Austria, MD sent at 03/05/2019  3:53 PM EDT ----- Regarding: Colposcopy Colposcopy  4 weeks

## 2019-04-02 ENCOUNTER — Ambulatory Visit: Payer: Medicaid Other | Admitting: Obstetrics and Gynecology

## 2019-04-02 ENCOUNTER — Other Ambulatory Visit: Payer: Self-pay

## 2019-04-16 ENCOUNTER — Other Ambulatory Visit: Payer: Self-pay

## 2019-04-16 ENCOUNTER — Other Ambulatory Visit (HOSPITAL_COMMUNITY)
Admission: RE | Admit: 2019-04-16 | Discharge: 2019-04-16 | Disposition: A | Discharge status: Home / Self Care | Payer: BC Managed Care – PPO | Source: Ambulatory Visit | Attending: Obstetrics and Gynecology | Admitting: Obstetrics and Gynecology

## 2019-04-16 ENCOUNTER — Ambulatory Visit (INDEPENDENT_AMBULATORY_CARE_PROVIDER_SITE_OTHER): Payer: BC Managed Care – PPO | Admitting: Obstetrics and Gynecology

## 2019-04-16 ENCOUNTER — Encounter: Payer: Self-pay | Admitting: Obstetrics and Gynecology

## 2019-04-16 VITALS — BP 100/70 | Ht 64.0 in | Wt 112.0 lb

## 2019-04-16 DIAGNOSIS — R87810 Cervical high risk human papillomavirus (HPV) DNA test positive: Secondary | ICD-10-CM | POA: Insufficient documentation

## 2019-04-16 DIAGNOSIS — Z3046 Encounter for surveillance of implantable subdermal contraceptive: Secondary | ICD-10-CM | POA: Diagnosis not present

## 2019-04-16 DIAGNOSIS — R87610 Atypical squamous cells of undetermined significance on cytologic smear of cervix (ASC-US): Secondary | ICD-10-CM | POA: Insufficient documentation

## 2019-04-16 DIAGNOSIS — Z8742 Personal history of other diseases of the female genital tract: Secondary | ICD-10-CM

## 2019-04-16 DIAGNOSIS — N939 Abnormal uterine and vaginal bleeding, unspecified: Secondary | ICD-10-CM | POA: Diagnosis not present

## 2019-04-16 DIAGNOSIS — N870 Mild cervical dysplasia: Secondary | ICD-10-CM

## 2019-04-16 MED ORDER — ESTROGENS CONJUGATED 1.25 MG PO TABS: 1.2500 mg | ORAL_TABLET | Freq: Every day | ORAL | 0 refills | Status: DC

## 2019-04-16 NOTE — Progress Notes (Signed)
Obstetrics & Gynecology Office Visit   Chief Complaint:  Chief Complaint  Patient presents with  . Colposcopy   History of Present Illness:Betty Powell Betty Powell is a 28 y.o. woman who presents today for continued surveillance for history of dysplasia. Last pap obtained on 02/26/2019 revealed ASCUS HPV positive.  Prior pap 06/19/2016 NIL.    Patient is a 28 y.o. [id number] presenting for contraception consult.  She is currently on Nexplanon and desiring to start anything other than pills.  She has a past medical history significant for smoking.  She specifically denies a history of migraine with aura, chronic hypertension and history of DVT/PE.  Reported No LMP recorded. Patient has had an implant..  Continues to have breakthrough bleeding on nexplanon.  Was treated for trichomonas but no improvement in bleeding pattern.  Also has a history of endometriosis and would like something that has some efficacy in that regard.  Pap/Treatment History:  06/07/2011 ASCUS HPV positive 10/24/2011 NIL HPV positive 02/23/2012 LGSIL HPV positive 03/21/2012 Colposcopy negative ectocervix and ECC bx 05/22/2015 NIL 06/19/2016 NIL 02/26/2019 ASCU HPV positive   Review of Systems: Review of Systems  Constitutional: Negative.   Genitourinary: Negative.   Neurological: Negative for headaches.  Endo/Heme/Allergies: Does not bruise/bleed easily.     Past Medical History:  Past Medical History:  Diagnosis Date  . Anxiety   . Endometriosis   . Epilepsy (HCC)   . Seizures (HCC)     Past Surgical History:  Past Surgical History:  Procedure Laterality Date  . NO PAST SURGERIES      Gynecologic History: No LMP recorded. Patient has had an implant.  Obstetric History: [id number]: G6P2032  Family History:  Family History  Problem Relation Age of Onset  . Cancer Mother   . Diabetes Father   . Heart disease Father   . Cancer Paternal Grandmother     Social History:  Social History   Socioeconomic History  .  Marital status: Married    Spouse name: Not on file  . Number of children: Not on file  . Years of education: Not on file  . Highest education level: Not on file  Occupational History  . Not on file  Social Needs  . Financial resource strain: Not on file  . Food insecurity:    Worry: Not on file    Inability: Not on file  . Transportation needs:    Medical: Not on file    Non-medical: Not on file  Tobacco Use  . Smoking status: Current Every Day Smoker    Packs/day: 0.50    Types: E-cigarettes  . Smokeless tobacco: Never Used  Substance and Sexual Activity  . Alcohol use: Yes  . Drug use: No  . Sexual activity: Yes    Birth control/protection: Implant  Lifestyle  . Physical activity:    Days per week: Not on file    Minutes per session: Not on file  . Stress: Not on file  Relationships  . Social connections:    Talks on phone: Not on file    Gets together: Not on file    Attends religious service: Not on file    Active member of club or organization: Not on file    Attends meetings of clubs or organizations: Not on file    Relationship status: Not on file  . Intimate partner violence:    Fear of current or ex partner: Not on file    Emotionally abused: Not on file  Physically abused: Not on file    Forced sexual activity: Not on file  Other Topics Concern  . 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 Patient has had an implant.  General: NAD, appears stated age, well nourished HEENT: normocephalic, anicteric Pulmonary: No increased work of breathing Genitourinary:  External: Normal external female genitalia.  Normal urethral meatus, normal  Bartholin's and Skene's glands.    Vagina: Normal vaginal mucosa, no evidence of prolapse.    Cervix:  Grossly normal in appearance, no bleeding  Rectal: deferred  Lymphatic: no evidence of inguinal lymphadenopathy Extremities: no edema, erythema, or tenderness Neurologic: Grossly intact Psychiatric: mood appropriate, affect full  Female chaperone present for pelvic and breast  portions of the physical exam    GYNECOLOGY CLINIC COLPOSCOPY PROCEDURE NOTE  Is the patient  pregnant: No LMP: No LMP recorded. Patient has had an implant. Smoking status:  reports that she has been smoking e-cigarettes. She has been smoking about 0.50 packs per day. She has never used smokeless tobacco. Contraception: Nexplanon History of STD:  Yes Future fertility desired:  Yes  Patient given informed consent, signed copy in the chart, time out was performed.  The patient was position in dorsal lithotomy position. Speculum was placed the cervix was visualized.   After application of acetic acid colposcopic inspection of the cervix was undertaken.   Colposcopy adequate, full visualization of transformation zone: Yes no visible lesions; corresponding biopsies obtained.  Random 12 O'Clock ECC specimen obtained:  Yes  All specimens were labeled and sent to pathology.   Patient was given post procedure instructions.  Will follow up pathology and manage accordingly.  Routine preventative health maintenance measures emphasized.  Assessment: 28 y.o. [id number] follow up for ASCUS HPV positive pap  Plan: Problem List Items Addressed This Visit    None    Visit Diagnoses    ASCUS with positive high risk HPV cervical    -  Primary   Relevant Orders   Surgical pathology      - I had a lengthly discussion with [name]  regarding the cause of dysplasia of the lower genital tract (including immunosuppression in the setting of HPV exposure and tobacco exposure). I explained the potential for progression to invasive malignancy, the recurrent nature of these lesions (and the need for close continued  followup). Results of today's colposcopy will dictate need for further evaluation and follow up per ASCCP guidelines.  - We discussed that 80% of the population will have exposure to human papilloma virus (HPV) during their lifetime.  HPV is a large group of viruses, and are also the causative virus for common warts and genital warts.  The pap smear tests for 13 high risk HPV strains that have some association with cervical cancer, but do not cause visual lesion such as warts.  HPV type 16 and 18 have the highest association with cervical cancer.  The vast majority of HPV infections will be uncomplicated at clear spontaneously in 12-18 months in non-immunocompromised patients.  Patient with compromised immune systems, those taking immunosuppressive drugs, or smoker have shown to have a lower clearance rate and higher persistence of HPV infection.    Currently there are no FDA approved treatments to  promote HPV clearance.  Gardasil vaccination is available to prevent HPV infection, but this is only beneficial pre-exposure.  Abstaining from intercourse will not increase clearance  Lastly we stressed that if properly followed HPV should not lead to cervical cancer.   The goal of screening is to identify patient who develop precancerous lesions of the cervix and treat these prior to progression to frank cervical cancer.  The incidence of cervical cancer is 7 cases per 100,000 women in the [place] a year.  This relatively low rate is in part due to universal screening as well as vaccination efforts.   - Breakthrough bleeding on nexplanon - will try 7 day course of premarin.  If continues to have breakthrough bleeding discussed Mirena, contraceptive patch, and nuvaring.  Patient was given a sample of placebo patch as she does have a tape allergy.  - She is comfortable with the plan and had her questions answered.  - Return in about 1 year (around 04/15/2020) for annual.   Malachy Mood, MD, [place], [facility] 04/16/2019, 4:46 PM

## 2019-04-16 NOTE — Patient Instructions (Signed)
80% of the population will have exposure to human papilloma virus (HPV) during their lifetime.  HPV is a large group of viruses, and are also the causative virus for common warts and genital warts.  The pap smear tests for 13 high risk HPV strains that have some association with cervical cancer, but do not cause visual lesion such as warts.  HPV type 16 and 18 have the highest association with cervical cancer.  The vast majority of HPV infections will be uncomplicated at clear spontaneously in 12-18 months in non-immunocompromised patients.  Patient with compromised immune systems, those taking immunosuppressive drugs, or smoker have shown to have a lower clearance rate and higher persistence of HPV infection.    Currently there are no FDA approved treatments to promote HPV clearance.  Gardasil vaccination is available to prevent HPV infection, but this is only beneficial pre-exposure.  Abstaining from intercourse will not increase clearance  If properly followed HPV should not lead to cervical cancer.   The goal of screening is to identify patient who develop precancerous lesions of the cervix and treat these prior to progression to frank cervical cancer.  The incidence of cervical cancer is 7 cases per 100,000 women in the [place] a year.  This relatively low rate is in part due to universal screening as well as vaccination efforts.       

## 2019-04-30 ENCOUNTER — Encounter: Payer: Self-pay | Admitting: Obstetrics and Gynecology

## 2019-04-30 ENCOUNTER — Ambulatory Visit (INDEPENDENT_AMBULATORY_CARE_PROVIDER_SITE_OTHER): Payer: BC Managed Care – PPO | Admitting: Obstetrics and Gynecology

## 2019-04-30 ENCOUNTER — Other Ambulatory Visit: Payer: Self-pay

## 2019-04-30 VITALS — BP 114/77 | Ht 64.0 in | Wt 114.0 lb

## 2019-04-30 DIAGNOSIS — Z3049 Encounter for surveillance of other contraceptives: Secondary | ICD-10-CM

## 2019-04-30 DIAGNOSIS — Z3046 Encounter for surveillance of implantable subdermal contraceptive: Secondary | ICD-10-CM

## 2019-04-30 MED ORDER — MEDROXYPROGESTERONE ACETATE 150 MG/ML IM SUSP: 150.0000 mg | INTRAMUSCULAR | 3 refills | Status: DC

## 2019-04-30 NOTE — Progress Notes (Signed)
   GYNECOLOGY PROCEDURE NOTE  Implanon removal discussed in detail.  Risks of infection, bleeding, nerve injury all reviewed.  Patient understands risks and desires to proceed.  Verbal consent obtained.  Patient is certain she wants the implanon removed.  All questions answered.  Procedure: Patient placed in dorsal supine with left arm above head, elbow flexed at 90 degrees, arm resting on examination table.  Implanon identified without problems.  Betadine scrub x3.  1 ml of 1% lidocaine injected under implanon device without problems.  Sterile gloves applied.  Small 0.5cm incision made at distal tip of implanon device with 11 blade scalpel.  Implanon brought to incision and grasped with a small kelly clamp.  Implanon removed intact without problems.  Pressure applied to incision.  Hemostasis obtained.  Steri-strips applied, followed by bandage and compression dressing.  Patient tolerated procedure well.  No complications.   Assessment: 28 y.o. year old female now s/p uncomplicated implanon removal.  Plan: 1.  Patient given post procedure precautions and asked to call for fever, chills, redness or drainage from her incision, bleeding from incision.  She understands she will likely have a small bruise near site of removal and can remove bandage tomorrow and steri-strips in approximately 1 week.  2) Contraception depo provera   Malachy Mood, MD, [place], [place]

## 2019-08-07 ENCOUNTER — Encounter: Payer: Self-pay | Admitting: Emergency Medicine

## 2019-08-07 ENCOUNTER — Emergency Department
Admission: EM | Admit: 2019-08-07 | Discharge: 2019-08-07 | Disposition: A | Discharge status: Home / Self Care | Payer: Medicaid Other | Attending: Emergency Medicine | Admitting: Emergency Medicine

## 2019-08-07 ENCOUNTER — Other Ambulatory Visit: Payer: Self-pay

## 2019-08-07 ENCOUNTER — Emergency Department: Payer: Medicaid Other

## 2019-08-07 DIAGNOSIS — W208XXA Other cause of strike by thrown, projected or falling object, initial encounter: Secondary | ICD-10-CM | POA: Diagnosis not present

## 2019-08-07 DIAGNOSIS — S9031XA Contusion of right foot, initial encounter: Secondary | ICD-10-CM | POA: Diagnosis not present

## 2019-08-07 DIAGNOSIS — F17290 Nicotine dependence, other tobacco product, uncomplicated: Secondary | ICD-10-CM | POA: Diagnosis not present

## 2019-08-07 DIAGNOSIS — Y939 Activity, unspecified: Secondary | ICD-10-CM | POA: Insufficient documentation

## 2019-08-07 DIAGNOSIS — Y929 Unspecified place or not applicable: Secondary | ICD-10-CM | POA: Diagnosis not present

## 2019-08-07 DIAGNOSIS — S99921A Unspecified injury of right foot, initial encounter: Secondary | ICD-10-CM | POA: Diagnosis present

## 2019-08-07 DIAGNOSIS — Y999 Unspecified external cause status: Secondary | ICD-10-CM | POA: Insufficient documentation
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
## 2019-09-03 ENCOUNTER — Ambulatory Visit (INDEPENDENT_AMBULATORY_CARE_PROVIDER_SITE_OTHER): Payer: Self-pay | Admitting: Maternal Newborn

## 2019-09-03 ENCOUNTER — Ambulatory Visit: Payer: Medicaid Other

## 2019-09-03 ENCOUNTER — Encounter: Payer: Self-pay | Admitting: Maternal Newborn

## 2019-09-03 ENCOUNTER — Other Ambulatory Visit: Payer: Self-pay

## 2019-09-03 VITALS — BP 90/60 | Wt 130.0 lb

## 2019-09-03 DIAGNOSIS — O99332 Smoking (tobacco) complicating pregnancy, second trimester: Secondary | ICD-10-CM | POA: Diagnosis not present

## 2019-09-03 DIAGNOSIS — F17210 Nicotine dependence, cigarettes, uncomplicated: Secondary | ICD-10-CM

## 2019-09-03 DIAGNOSIS — O99330 Smoking (tobacco) complicating pregnancy, unspecified trimester: Secondary | ICD-10-CM

## 2019-09-03 DIAGNOSIS — Z3A17 17 weeks gestation of pregnancy: Secondary | ICD-10-CM

## 2019-09-03 DIAGNOSIS — Z3480 Encounter for supervision of other normal pregnancy, unspecified trimester: Secondary | ICD-10-CM

## 2019-09-03 DIAGNOSIS — Z3201 Encounter for pregnancy test, result positive: Secondary | ICD-10-CM

## 2019-09-03 DIAGNOSIS — N912 Amenorrhea, unspecified: Secondary | ICD-10-CM

## 2019-09-03 DIAGNOSIS — Z369 Encounter for antenatal screening, unspecified: Secondary | ICD-10-CM

## 2019-09-03 LAB — POCT URINALYSIS DIPSTICK OB
Glucose, UA: NEGATIVE
POC,PROTEIN,UA: NEGATIVE

## 2019-09-03 LAB — POCT URINE PREGNANCY: Preg Test, Ur: POSITIVE — AB

## 2019-09-03 NOTE — Progress Notes (Signed)
C/O  Needs to know exactly how far along she is 'today'; sharp pain left side. rj

## 2019-09-03 NOTE — Progress Notes (Signed)
09/03/2019   Chief Complaint: Desires prenatal care.  Transfer of Care Patient: no  History of Present Illness: Betty Powell is a 28 y.o. [id number] at [redacted]w[redacted]d based on Patient's last menstrual period on 05/05/2019 (approximate), with an Estimated Date of Delivery: 02/09/2020, with the above CC.   Her periods were: irregular periods. She had a menstrual cycle following removal of her Nexplanon in June. She may have had a miscarriage following that cycle; had bleeding that began September 4 and lasted 5 days. She has not had a cycle since that time.  She has Negative signs or symptoms of nausea/vomiting of pregnancy. She has Negative signs or symptoms of miscarriage or preterm labor She identifies Negative Zika risk factors for her and her partner On any different medications around the time she conceived/early pregnancy: No  History of varicella: Yes    Review of Systems  Constitutional:       Change in appetite  HENT: Negative.   Eyes: Negative.   Respiratory: Negative.   Cardiovascular: Negative.   Gastrointestinal: Negative.   Genitourinary: Positive for urgency.  Musculoskeletal: Negative.   Skin: Negative.   Neurological: Negative.   Endo/Heme/Allergies:       Hot flashes  Psychiatric/Behavioral: The patient is nervous/anxious.   Breasts: positive for breast tenderness  ROS: Review of systems was otherwise negative, except as stated in the above HPI.  OBGYN History: As per HPI. OB History  Gravida Para Term Preterm AB Living  7 2 2   3 2   SAB TAB Ectopic Multiple Live Births  3       2    # Outcome Date GA Lbr Len/2nd Weight Sex Delivery Anes PTL Lv  7 Current           6 Term 02/17/17 [redacted]w[redacted]d 03:50 / 00:06 8 lb (3.63 kg) M Vag-Spont None  LIV     Birth Comments: no anomalies observed at delivery  5 Term 01/08/12    M Vag-Spont   LIV  4 Gravida           3 SAB           2 SAB           1 SAB             Any issues with any prior pregnancies: History of SAB Any prior  children are healthy, doing well, without any problems or issues: yes History of pap smears: Yes. Last pap smear 02/26/2019. Abnormal: yes, ACSCUS/HPV positive, colposcopy done, next follow-up Pap due April 2021 History of STIs: Yes, remote history of gonorrhea/chalmydia   Past Medical History: Past Medical History:  Diagnosis Date  . Anxiety   . Endometriosis   . Epilepsy ([place])   . Seizures ([place])     Past Surgical History: Past Surgical History:  Procedure Laterality Date  . NO PAST SURGERIES      Family History:  Family History  Problem Relation Age of Onset  . Cancer Mother        lung  . Diabetes Father   . Heart disease Father   . Cancer Paternal Grandmother        cervical   She does not have a history of any female cancers, bleeding or blood clotting disorders.  There is no history of intellectual disability, birth defects or genetic disorders in her or the FOB's history.  Social History:  Social History   Socioeconomic History  . Marital status: Married    Spouse name:  Not on file  . Number of children: 2  . Years of education: 71  . Highest education level: Not on file  Occupational History  . Not on file  Social Needs  . Financial resource strain: Not on file  . Food insecurity    Worry: Not on file    Inability: Not on file  . Transportation needs    Medical: Not on file    Non-medical: Not on file  Tobacco Use  . Smoking status: Current Every Day Smoker    Packs/day: 0.50    Types: Cigarettes  . Smokeless tobacco: Never Used  Substance and Sexual Activity  . Alcohol use: Not Currently  . Drug use: No  . Sexual activity: Yes    Birth control/protection: None  Lifestyle  . Physical activity    Days per week: Not on file    Minutes per session: Not on file  . Stress: Not on file  Relationships  . Social Musician on phone: Not on file    Gets together: Not on file    Attends religious service: Not on file    Active member of  club or organization: Not on file    Attends meetings of clubs or organizations: Not on file    Relationship status: Not on file  . Intimate partner violence    Fear of current or ex partner: Not on file    Emotionally abused: Not on file    Physically abused: Not on file    Forced sexual activity: Not on file  Other Topics Concern  . Not on file  Social History Narrative   02/2019 3 months clean and sober   Any cats in the household: yes, advised precautions to prevent toxoplasmosis infection as there is no one else to clean the litter box. She is using a mask and we discussed double gloving to handle litter/feces.  Domestic violence screening is negative at this time.  Allergy: No Known Allergies  Current Outpatient Medications: No current outpatient medications on file.   Physical Exam:   BP 90/60   Wt 130 lb (59 kg)   LMP 05/05/2019 (Approximate)   BMI 22.31 kg/m  Body mass index is 22.31 kg/m.  Unable to complete  Assessment: [name] is a 28 y.o. [id number] at [redacted]w[redacted]d based on Patient's last menstrual period on 05/05/2019 (approximate), with an Estimated Date of Delivery: 02/09/2020, presenting for prenatal care.  Plan:  1) Avoid alcoholic beverages. 2) Patient encouraged not to smoke. Currently smoking about 0.5 ppd. 3) Discontinue the use of all non-medicinal drugs and chemicals.  4) Take prenatal vitamins daily.  5) Seatbelt use advised 6) Nutrition and  food safety discussed. 7) Hospital and practice style delivering at [facility] discussed  8) Patient is asked about travel to areas at risk for the Zika virus, and counseled to avoid travel and exposure to mosquitoes or  partners who may have themselves been exposed to the virus.   9) Genetic Screening, such as with 1st Trimester Screening, cell free fetal DNA, AFP testing was discussed with patient. She plans to decline genetic testing this pregnancy.  We planned for a dating ultrasound today, but I was advised by staff  that we could not proceed due to status of insurance coverage. After discussion, Betty Powell declined to continue with the physical exam and laboratory portions of the visit today. New OB labs, physical exam, and dating ultrasound will need to be done at the next visit.  Problem list reviewed and updated.  Betty BruinsJacelyn Finlee Powell, CNM [facility], [facility] 09/03/2019  8:46 AM

## 2019-09-04 ENCOUNTER — Encounter: Payer: Self-pay | Admitting: Maternal Newborn

## 2019-09-04 DIAGNOSIS — O99330 Smoking (tobacco) complicating pregnancy, unspecified trimester: Secondary | ICD-10-CM | POA: Insufficient documentation

## 2019-09-04 DIAGNOSIS — Z3480 Encounter for supervision of other normal pregnancy, unspecified trimester: Secondary | ICD-10-CM | POA: Insufficient documentation

## 2019-09-08 ENCOUNTER — Other Ambulatory Visit (HOSPITAL_COMMUNITY)
Admission: RE | Admit: 2019-09-08 | Discharge: 2019-09-08 | Disposition: A | Discharge status: Home / Self Care | Payer: Medicaid Other | Source: Ambulatory Visit | Attending: Maternal Newborn | Admitting: Maternal Newborn

## 2019-09-08 ENCOUNTER — Other Ambulatory Visit: Payer: Self-pay

## 2019-09-08 ENCOUNTER — Ambulatory Visit (INDEPENDENT_AMBULATORY_CARE_PROVIDER_SITE_OTHER): Payer: Medicaid Other

## 2019-09-08 ENCOUNTER — Encounter: Payer: Self-pay | Admitting: Maternal Newborn

## 2019-09-08 ENCOUNTER — Ambulatory Visit (INDEPENDENT_AMBULATORY_CARE_PROVIDER_SITE_OTHER): Payer: Medicaid Other | Admitting: Maternal Newborn

## 2019-09-08 VITALS — BP 100/62 | Wt 131.0 lb

## 2019-09-08 DIAGNOSIS — Z113 Encounter for screening for infections with a predominantly sexual mode of transmission: Secondary | ICD-10-CM | POA: Insufficient documentation

## 2019-09-08 DIAGNOSIS — Z3A18 18 weeks gestation of pregnancy: Secondary | ICD-10-CM

## 2019-09-08 DIAGNOSIS — Z3A01 Less than 8 weeks gestation of pregnancy: Secondary | ICD-10-CM

## 2019-09-08 DIAGNOSIS — O3481 Maternal care for other abnormalities of pelvic organs, first trimester: Secondary | ICD-10-CM

## 2019-09-08 DIAGNOSIS — Z3482 Encounter for supervision of other normal pregnancy, second trimester: Secondary | ICD-10-CM | POA: Diagnosis not present

## 2019-09-08 DIAGNOSIS — N8311 Corpus luteum cyst of right ovary: Secondary | ICD-10-CM | POA: Diagnosis not present

## 2019-09-08 DIAGNOSIS — Z3480 Encounter for supervision of other normal pregnancy, unspecified trimester: Secondary | ICD-10-CM | POA: Insufficient documentation

## 2019-09-08 DIAGNOSIS — Z369 Encounter for antenatal screening, unspecified: Secondary | ICD-10-CM

## 2019-09-08 NOTE — Progress Notes (Signed)
ROB/US C/o fatigue  Denies lof, no vb No FM yet

## 2019-09-08 NOTE — Progress Notes (Signed)
    Routine Prenatal Care Visit  Subjective  Betty Powell is a 28 y.o. [id number] at [redacted]w[redacted]d being seen today for ongoing prenatal care.  She is currently monitored for the following issues for this low-risk pregnancy and has Substance abuse ([place]); Closed fracture of great toe of right foot; History of abnormal cervical Pap smear; Supervision of other normal pregnancy, antepartum; and Tobacco use during pregnancy, antepartum on their problem list.  ----------------------------------------------------------------------------------- Patient reports anxiety symptoms have increased some. She is coping with this and does not desire interventions at this time.  Vag. Bleeding: None.  ----------------------------------------------------------------------------------- The following portions of the patient's history were reviewed and updated as appropriate: allergies, current medications, past family history, past medical history, past social history, past surgical history and problem list. Problem list updated.  Objective  Blood pressure 100/62, weight 131 lb (59.4 kg), last menstrual period 05/05/2019. Pregravid weight 120 lb (54.4 kg) Total Weight Gain 11 lb (4.99 kg)  Fetal Status: Fetal Heart Rate (bpm): 151 ([place])         General:  Alert, oriented and cooperative. Patient is in no acute distress.  Skin: Skin is warm and dry. No rash noted.   Cardiovascular: Normal heart rate noted, RRR, no murmurs, rubs, or gallops  Respiratory: Normal respiratory effort, CTAB  Abdomen: Soft, gravid, appropriate for gestational age. Pain/Pressure: Absent     Pelvic:  Cervical exam deferred        Extremities: Normal range of motion.     Mental Status: Normal mood and affect. Normal behavior. Normal judgment and thought content.     Assessment   28 y.o. [id number] at [redacted]w[redacted]d, EDD 02/09/2020 by Last Menstrual Period presenting for a routine prenatal visit.  Plan   SIXTH Problems (from 09/03/19 to present)    Problem Noted Resolved   Supervision of other normal pregnancy, antepartum 09/04/2019 by Rexene Agent, CNM No   Overview Signed 09/04/2019 11:26 AM by Rexene Agent, [place] Prenatal Labs  Dating  Blood type:     Genetic Screen 1 Screen:    AFP:     Quad:     NIPS: Antibody:   Anatomic [place]  Rubella:   Varicella:    GTT Early:               Third trimester:  RPR: Non Reactive (04/22 1434)   Rhogam  HBsAg: Negative (04/22 1434)   TDaP vaccine                       Flu Shot: HIV: Non Reactive (04/22 1434)   Baby Food                                GBS:   Contraception  Pap: 02/26/2019 ASCUS/HPV Positive with colposcopy  CBB     CS/VBAC    Support Person               Ultrasound today shows a singleton IUP at 7 weeks, 5 days gestation. FHR 151 bpm. Results reviewed with patient. EDD changed to 04/21/2020 per ACOG Guidelines. Aptima collected, NOB labs done today.   Please refer to After Visit Summary for other counseling recommendations.   Return in about 4 weeks (around 10/06/2019) for ROB.  Avel Sensor, CNM 09/08/2019  5:04 PM

## 2019-09-08 NOTE — Patient Instructions (Signed)
First Trimester of Pregnancy The first trimester of pregnancy is from week 1 until the end of week 13 (months 1 through 3). A week after a sperm fertilizes an egg, the egg will implant on the wall of the uterus. This embryo will begin to develop into a baby. Genes from you and your partner will form the baby. The female genes will determine whether the baby will be a boy or a girl. At 6-8 weeks, the eyes and face will be formed, and the heartbeat can be seen on ultrasound. At the end of 12 weeks, all the baby's organs will be formed. Now that you are pregnant, you will want to do everything you can to have a healthy baby. Two of the most important things are to get good prenatal care and to follow your health care provider's instructions. Prenatal care is all the medical care you receive before the baby's birth. This care will help prevent, find, and treat any problems during the pregnancy and childbirth. Body changes during your first trimester Your body goes through many changes during pregnancy. The changes vary from woman to woman.  You may gain or lose a couple of pounds at first.  You may feel sick to your stomach (nauseous) and you may throw up (vomit). If the vomiting is uncontrollable, call your health care provider.  You may tire easily.  You may develop headaches that can be relieved by medicines. All medicines should be approved by your health care provider.  You may urinate more often. Painful urination may mean you have a bladder infection.  You may develop heartburn as a result of your pregnancy.  You may develop constipation because certain hormones are causing the muscles that push stool through your intestines to slow down.  You may develop hemorrhoids or swollen veins (varicose veins).  Your breasts may begin to grow larger and become tender. Your nipples may stick out more, and the tissue that surrounds them (areola) may become darker.  Your gums may bleed and may be  sensitive to brushing and flossing.  Dark spots or blotches (chloasma, mask of pregnancy) may develop on your face. This will likely fade after the baby is born.  Your menstrual periods will stop.  You may have a loss of appetite.  You may develop cravings for certain kinds of food.  You may have changes in your emotions from day to day, such as being excited to be pregnant or being concerned that something may go wrong with the pregnancy and baby.  You may have more vivid and strange dreams.  You may have changes in your hair. These can include thickening of your hair, rapid growth, and changes in texture. Some women also have hair loss during or after pregnancy, or hair that feels dry or thin. Your hair will most likely return to normal after your baby is born. What to expect at prenatal visits During a routine prenatal visit:  You will be weighed to make sure you and the baby are growing normally.  Your blood pressure will be taken.  Your abdomen will be measured to track your baby's growth.  The fetal heartbeat will be listened to between weeks 10 and 14 of your pregnancy.  Test results from any previous visits will be discussed. Your health care provider may ask you:  How you are feeling.  If you are feeling the baby move.  If you have had any abnormal symptoms, such as leaking fluid, bleeding, severe headaches, or abdominal   cramping.  If you are using any tobacco products, including cigarettes, chewing tobacco, and electronic cigarettes.  If you have any questions. Other tests that may be performed during your first trimester include:  Blood tests to find your blood type and to check for the presence of any previous infections. The tests will also be used to check for low iron levels (anemia) and protein on red blood cells (Rh antibodies). Depending on your risk factors, or if you previously had diabetes during pregnancy, you may have tests to check for high blood sugar  that affects pregnant women (gestational diabetes).  Urine tests to check for infections, diabetes, or protein in the urine.  An ultrasound to confirm the proper growth and development of the baby.  Fetal screens for spinal cord problems (spina bifida) and Down syndrome.  HIV (human immunodeficiency virus) testing. Routine prenatal testing includes screening for HIV, unless you choose not to have this test.  You may need other tests to make sure you and the baby are doing well. Follow these instructions at home: Medicines  Follow your health care provider's instructions regarding medicine use. Specific medicines may be either safe or unsafe to take during pregnancy.  Take a prenatal vitamin that contains at least 600 micrograms (mcg) of folic acid.  If you develop constipation, try taking a stool softener if your health care provider approves. Eating and drinking   Eat a balanced diet that includes fresh fruits and vegetables, whole grains, good sources of protein such as meat, eggs, or tofu, and low-fat dairy. Your health care provider will help you determine the amount of weight gain that is right for you.  Avoid raw meat and uncooked cheese. These carry germs that can cause birth defects in the baby.  Eating four or five small meals rather than three large meals a day may help relieve nausea and vomiting. If you start to feel nauseous, eating a few soda crackers can be helpful. Drinking liquids between meals, instead of during meals, also seems to help ease nausea and vomiting.  Limit foods that are high in fat and processed sugars, such as fried and sweet foods.  To prevent constipation: ? Eat foods that are high in fiber, such as fresh fruits and vegetables, whole grains, and beans. ? Drink enough fluid to keep your urine clear or pale yellow. Activity  Exercise only as directed by your health care provider. Most women can continue their usual exercise routine during  pregnancy. Try to exercise for 30 minutes at least 5 days a week. Exercising will help you: ? Control your weight. ? Stay in shape. ? Be prepared for labor and delivery.  Experiencing pain or cramping in the lower abdomen or lower back is a good sign that you should stop exercising. Check with your health care provider before continuing with normal exercises.  Try to avoid standing for long periods of time. Move your legs often if you must stand in one place for a long time.  Avoid heavy lifting.  Wear low-heeled shoes and practice good posture.  You may continue to have sex unless your health care provider tells you not to. Relieving pain and discomfort  Wear a good support bra to relieve breast tenderness.  Take warm sitz baths to soothe any pain or discomfort caused by hemorrhoids. Use hemorrhoid cream if your health care provider approves.  Rest with your legs elevated if you have leg cramps or low back pain.  If you develop varicose veins in   your legs, wear support hose. Elevate your feet for 15 minutes, 3-4 times a day. Limit salt in your diet. Prenatal care  Schedule your prenatal visits by the twelfth week of pregnancy. They are usually scheduled monthly at first, then more often in the last 2 months before delivery.  Write down your questions. Take them to your prenatal visits.  Keep all your prenatal visits as told by your health care provider. This is important. Safety  Wear your seat belt at all times when driving.  Make a list of emergency phone numbers, including numbers for family, friends, the hospital, and police and fire departments. General instructions  Ask your health care provider for a referral to a local prenatal education class. Begin classes no later than the beginning of month 6 of your pregnancy.  Ask for help if you have counseling or nutritional needs during pregnancy. Your health care provider can offer advice or refer you to specialists for help  with various needs.  Do not use hot tubs, steam rooms, or saunas.  Do not douche or use tampons or scented sanitary pads.  Do not cross your legs for long periods of time.  Avoid cat litter boxes and soil used by cats. These carry germs that can cause birth defects in the baby and possibly loss of the fetus by miscarriage or stillbirth.  Avoid all smoking, herbs, alcohol, and medicines not prescribed by your health care provider. Chemicals in these products affect the formation and growth of the baby.  Do not use any products that contain nicotine or tobacco, such as cigarettes and e-cigarettes. If you need help quitting, ask your health care provider. You may receive counseling support and other resources to help you quit.  Schedule a dentist appointment. At home, brush your teeth with a soft toothbrush and be gentle when you floss. Contact a health care provider if:  You have dizziness.  You have mild pelvic cramps, pelvic pressure, or nagging pain in the abdominal area.  You have persistent nausea, vomiting, or diarrhea.  You have a bad smelling vaginal discharge.  You have pain when you urinate.  You notice increased swelling in your face, hands, legs, or ankles.  You are exposed to fifth disease or chickenpox.  You are exposed to German measles (rubella) and have never had it. Get help right away if:  You have a fever.  You are leaking fluid from your vagina.  You have spotting or bleeding from your vagina.  You have severe abdominal cramping or pain.  You have rapid weight gain or loss.  You vomit blood or material that looks like coffee grounds.  You develop a severe headache.  You have shortness of breath.  You have any kind of trauma, such as from a fall or a car accident. Summary  The first trimester of pregnancy is from week 1 until the end of week 13 (months 1 through 3).  Your body goes through many changes during pregnancy. The changes vary from  woman to woman.  You will have routine prenatal visits. During those visits, your health care provider will examine you, discuss any test results you may have, and talk with you about how you are feeling. This information is not intended to replace advice given to you by your health care provider. Make sure you discuss any questions you have with your health care provider. Document Released: 10/17/2001 Document Revised: 10/05/2017 Document Reviewed: 10/04/2016 Elsevier Patient Education  2020 [employer] Inc.  

## 2019-09-09 LAB — RPR+RH+ABO+RUB AB+AB SCR+CB...
Antibody Screen: NEGATIVE
HIV Screen 4th Generation wRfx: NONREACTIVE
Hematocrit: 39.4 % (ref 34.0–46.6)
Hemoglobin: 13.9 g/dL (ref 11.1–15.9)
Hepatitis B Surface Ag: NEGATIVE
MCH: 34.2 pg — ABNORMAL HIGH (ref 26.6–33.0)
MCHC: 35.3 g/dL (ref 31.5–35.7)
MCV: 97 fL (ref 79–97)
Platelets: 291 10*3/uL (ref 150–450)
RBC: 4.07 x10E6/uL (ref 3.77–5.28)
RDW: 11.2 % — ABNORMAL LOW (ref 11.7–15.4)
RPR Ser Ql: NONREACTIVE
Rh Factor: POSITIVE
Rubella Antibodies, IGG: 1.57 index (ref >0.99)
Varicella zoster IgG: 1149 index (ref >165)
WBC: 8.8 10*3/uL (ref 3.4–10.8)

## 2019-09-10 LAB — CERVICOVAGINAL ANCILLARY ONLY
Chlamydia: NEGATIVE
Comment: NEGATIVE
Comment: NEGATIVE
Comment: NORMAL
Neisseria Gonorrhea: NEGATIVE
Trichomonas: NEGATIVE

## 2019-10-06 ENCOUNTER — Encounter: Payer: Medicaid Other | Admitting: Obstetrics and Gynecology

## 2019-10-21 ENCOUNTER — Ambulatory Visit (INDEPENDENT_AMBULATORY_CARE_PROVIDER_SITE_OTHER): Payer: Medicaid Other | Admitting: Obstetrics and Gynecology

## 2019-10-21 ENCOUNTER — Other Ambulatory Visit: Payer: Self-pay

## 2019-10-21 ENCOUNTER — Encounter: Payer: Self-pay | Admitting: Obstetrics and Gynecology

## 2019-10-21 VITALS — BP 110/66 | Ht 64.0 in | Wt 135.0 lb

## 2019-10-21 DIAGNOSIS — N926 Irregular menstruation, unspecified: Secondary | ICD-10-CM

## 2019-10-21 DIAGNOSIS — Z3202 Encounter for pregnancy test, result negative: Secondary | ICD-10-CM | POA: Diagnosis not present

## 2019-10-21 DIAGNOSIS — Z30013 Encounter for initial prescription of injectable contraceptive: Secondary | ICD-10-CM

## 2019-10-21 LAB — POCT URINE PREGNANCY: Preg Test, Ur: NEGATIVE

## 2019-10-21 MED ORDER — MEDROXYPROGESTERONE ACETATE 150 MG/ML IM SUSY: 150.0000 mg | PREFILLED_SYRINGE | Freq: Once | INTRAMUSCULAR | 2 refills | Status: DC

## 2019-10-21 NOTE — Patient Instructions (Signed)
I value your feedback and entrusting us with your care. If you get a [place] patient survey, I would appreciate you taking the time to let us know about your experience today. Thank you!  As of October 16, 2019, your lab results will be released to your MyChart immediately, before I even have a chance to see them. Please give me time to review them and contact you if there are any abnormalities. Thank you for your patience.  

## 2019-10-21 NOTE — Progress Notes (Signed)
Patient, No Pcp Per   Chief Complaint  Patient presents with  . Contraception    interested in Depo    HPI:      [name] is a 28 y.o. [id number] who LMP was No LMP recorded. (Menstrual status: Other)., presents today for [facility] consult.  Would like depo. Did in high school and denies any side effects. Did nexplanon in past, removed 6/20 due to BTB.  S/p EAB 11/20 with D&C. No menses since. Having breast tenderness/cramping and flutters. Has not done UPT, never had f/u. Pt concerned about pregnancy. She is sex active, using condoms.  Hx of endometriosis.  Hx of abn pap, with colpo/bx 6/20 with Dr. Georgianne Fick.   Patient Active Problem List   Diagnosis Date Noted  . Tobacco use during pregnancy, antepartum 09/04/2019  . History of abnormal cervical Pap smear 04/16/2019  . Closed fracture of great toe of right foot 05/10/2017  . Substance abuse ([place]) 01/08/2017    Past Surgical History:  Procedure Laterality Date  . NO PAST SURGERIES      Family History  Problem Relation Age of Onset  . Cancer Mother        lung  . Diabetes Father   . Heart disease Father   . Cancer Paternal Grandmother        cervical    Social History   Socioeconomic History  . Marital status: Married    Spouse name: Not on file  . Number of children: 2  . Years of education: 45  . Highest education level: Not on file  Occupational History  . Not on file  Tobacco Use  . Smoking status: Current Every Day Smoker    Packs/day: 0.50    Types: Cigarettes  . Smokeless tobacco: Never Used  Substance and Sexual Activity  . Alcohol use: Not Currently  . Drug use: No  . Sexual activity: Yes    Birth control/protection: None, Condom  Other Topics Concern  . 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 Difficulty of Paying Living Expenses: Not on file  Food Insecurity:   . Worried About  Charity fundraiser in the Last Year: Not on file  . Ran Out of Food in the Last Year: Not on file  Transportation Needs:   . Lack of Transportation (Medical): Not on file  . Lack of Transportation (Non-Medical): Not on file  Physical Activity:   . Days of Exercise per Week: Not on file  . Minutes of Exercise per Session: Not on file  Stress:   . Feeling of Stress : Not on file  Social Connections:   . Frequency of Communication with Friends and Family: Not on file  . Frequency of Social Gatherings with Friends and Family: Not on file  . Attends Religious Services: Not on file  . Active Member of Clubs or Organizations: Not on file  . Attends Archivist Meetings: Not on file  . Marital Status: Not on file  Intimate Partner Violence:   . Fear of Current or Ex-Partner: Not on file  . Emotionally Abused: Not on file  . Physically Abused: Not on file  . Sexually Abused: Not on file    No outpatient medications prior to visit.   No facility-administered medications prior to visit.      ROS:  Review of Systems  Constitutional: Negative for fever.  Gastrointestinal: Negative for blood in stool, constipation, diarrhea, nausea and vomiting.  Genitourinary: Negative for dyspareunia, dysuria, flank pain, frequency, hematuria, urgency, vaginal bleeding, vaginal discharge and vaginal pain.  Musculoskeletal: Negative for back pain.  Skin: Negative for rash.  BREAST: No symptoms   OBJECTIVE:   Vitals:  BP 110/66   Ht 5\' 4"  (1.626 m)   Wt 135 lb (61.2 kg)   Breastfeeding No   BMI 23.17 kg/m   Physical Exam Vitals reviewed.  Constitutional:      Appearance: She is well-developed.  Pulmonary:     Effort: Pulmonary effort is normal.  Musculoskeletal:        General: Normal range of motion.     Cervical back: Normal range of motion.  Skin:    General: Skin is warm and dry.  Neurological:     General: No focal deficit present.     Mental Status: She is alert and  oriented to person, place, and time.     Cranial Nerves: No cranial nerve deficit.  Psychiatric:        Mood and Affect: Mood normal.        Behavior: Behavior normal.        Thought Content: Thought content normal.        Judgment: Judgment normal.     Results: Results for orders placed or performed in visit on 10/21/19 (from the past 24 hour(s))  POCT urine pregnancy     Status: Normal   Collection Time: 10/21/19  3:34 PM  Result Value Ref Range   Preg Test, Ur Negative Negative     Assessment/Plan: Encounter for initial prescription of injectable contraceptive - Plan: medroxyPROGESTERone Acetate 150 MG/ML SUSY; Start depo with menses, condoms. Been on it in past. Did well. Hx of endometriosis--will be good for sx control, too.   Late menses - Plan: POCT urine pregnancy, After EAB. Neg UPT. Reassurance.    Meds ordered this encounter  Medications  . medroxyPROGESTERone Acetate 150 MG/ML SUSY    Sig: Inject 1 mL (150 mg total) into the muscle once for 1 dose.    Dispense:  1 mL    Refill:  2    Order Specific Question:   Supervising Provider    Answer:   10/23/19 Nadara Mustard      Return if symptoms worsen or fail to improve./ 6/21 annual and pap  Sargun Rummell B. Trivia Heffelfinger, PA-C 10/21/2019 4:16 PM

## 2019-11-14 ENCOUNTER — Other Ambulatory Visit: Payer: Self-pay

## 2019-11-14 ENCOUNTER — Ambulatory Visit (INDEPENDENT_AMBULATORY_CARE_PROVIDER_SITE_OTHER): Payer: Medicaid Other | Admitting: Obstetrics and Gynecology

## 2019-11-14 ENCOUNTER — Encounter: Payer: Self-pay | Admitting: Obstetrics and Gynecology

## 2019-11-14 ENCOUNTER — Other Ambulatory Visit (HOSPITAL_COMMUNITY)
Admission: RE | Admit: 2019-11-14 | Discharge: 2019-11-14 | Disposition: A | Discharge status: Home / Self Care | Payer: Medicaid Other | Source: Ambulatory Visit | Attending: Obstetrics and Gynecology | Admitting: Obstetrics and Gynecology

## 2019-11-14 VITALS — BP 108/62 | HR 94 | Ht 64.0 in | Wt 135.0 lb

## 2019-11-14 DIAGNOSIS — N760 Acute vaginitis: Secondary | ICD-10-CM

## 2019-11-14 DIAGNOSIS — B9689 Other specified bacterial agents as the cause of diseases classified elsewhere: Secondary | ICD-10-CM | POA: Diagnosis not present

## 2019-11-14 DIAGNOSIS — Z113 Encounter for screening for infections with a predominantly sexual mode of transmission: Secondary | ICD-10-CM

## 2019-11-14 LAB — POCT WET PREP WITH KOH
Clue Cells Wet Prep HPF POC: POSITIVE
KOH Prep POC: POSITIVE — AB
Trichomonas, UA: NEGATIVE
Yeast Wet Prep HPF POC: NEGATIVE

## 2019-11-14 MED ORDER — METRONIDAZOLE 500 MG PO TABS: 500.0000 mg | ORAL_TABLET | Freq: Two times a day (BID) | ORAL | 0 refills | Status: DC

## 2019-11-14 NOTE — Patient Instructions (Signed)
I value your feedback and entrusting us with your care. If you get a [place] patient survey, I would appreciate you taking the time to let us know about your experience today. Thank you!  As of October 16, 2019, your lab results will be released to your MyChart immediately, before I even have a chance to see them. Please give me time to review them and contact you if there are any abnormalities. Thank you for your patience.  

## 2019-11-14 NOTE — Progress Notes (Signed)
Patient, No Pcp Per   Chief Complaint  Patient presents with  . Gynecologic Exam    no known exposure. possibly bv (odd odor x 1.5 days)    HPI:      [name] is a 29 y.o. [id number] who LMP was Patient's last menstrual period was 10/23/2019., presents today for vaginal odor and pelvic discomfort for the past 1 1/2 days. No increased d/c, irritation. Hx of BV in the past. No recent abx use, no urin sx, LBP, fevers. She is sex active with a new partner, using condoms. Wants STD testing. Neg STD testing 11/20.  On depo, doing well.   Patient Active Problem List   Diagnosis Date Noted  . Tobacco use during pregnancy, antepartum 09/04/2019  . History of abnormal cervical Pap smear 04/16/2019  . Closed fracture of great toe of right foot 05/10/2017  . Substance abuse ([place]) 01/08/2017    Past Surgical History:  Procedure Laterality Date  . NO PAST SURGERIES      Family History  Problem Relation Age of Onset  . Cancer Mother        lung  . Diabetes Father   . Heart disease Father   . Cancer Paternal Grandmother        cervical    Social History   Socioeconomic History  . Marital status: Married    Spouse name: Not on file  . Number of children: 2  . Years of education: 31  . Highest education level: Not on file  Occupational History  . Not on file  Tobacco Use  . Smoking status: Current Every Day Smoker    Packs/day: 0.50    Types: Cigarettes  . Smokeless tobacco: Never Used  Substance and Sexual Activity  . Alcohol use: Not Currently  . Drug use: No  . Sexual activity: Yes    Birth control/protection: None, Condom  Other Topics Concern  . 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 Difficulty of Paying Living Expenses: Not on file  Food Insecurity:   . Worried About Charity fundraiser in the Last Year: Not on file  . Ran Out of Food in the Last  Year: Not on file  Transportation Needs:   . Lack of Transportation (Medical): Not on file  . Lack of Transportation (Non-Medical): Not on file  Physical Activity:   . Days of Exercise per Week: Not on file  . Minutes of Exercise per Session: Not on file  Stress:   . Feeling of Stress : Not on file  Social Connections:   . Frequency of Communication with Friends and Family: Not on file  . Frequency of Social Gatherings with Friends and Family: Not on file  . Attends Religious Services: Not on file  . Active Member of Clubs or Organizations: Not on file  . Attends Archivist Meetings: Not on file  . Marital Status: Not on file  Intimate Partner Violence:   . Fear of Current or Ex-Partner: Not on file  . Emotionally Abused: Not on file  . Physically Abused: Not on file  . Sexually Abused: Not on file    Outpatient Medications Prior to Visit  Medication Sig Dispense Refill  . medroxyPROGESTERone Acetate 150 MG/ML SUSY Inject 1 mL (150 mg total) into the muscle once for 1 dose. 1 mL 2  No facility-administered medications prior to visit.      ROS:  Review of Systems  Constitutional: Negative for fever.  Gastrointestinal: Negative for blood in stool, constipation, diarrhea, nausea and vomiting.  Genitourinary: Positive for pelvic pain. Negative for dyspareunia, dysuria, flank pain, frequency, hematuria, urgency, vaginal bleeding, vaginal discharge and vaginal pain.  Musculoskeletal: Negative for back pain.  Skin: Negative for rash.   BREAST: No symptoms   OBJECTIVE:   Vitals:  BP 108/62 (BP Location: Left Arm, Patient Position: Sitting, Cuff Size: Normal)   Pulse 94   Ht 5\' 4"  (1.626 m)   Wt 135 lb (61.2 kg)   LMP 10/23/2019   BMI 23.17 kg/m   Physical Exam Vitals reviewed.  Constitutional:      Appearance: She is well-developed.  Pulmonary:     Effort: Pulmonary effort is normal.  Genitourinary:    General: Normal vulva.     Pubic Area: No rash.        Labia:        Right: No rash, tenderness or lesion.        Left: No rash, tenderness or lesion.      Vagina: Normal. No vaginal discharge, erythema or tenderness.     Cervix: Normal.     Uterus: Normal. Not enlarged and not tender.      Adnexa: Right adnexa normal.       Right: No mass or tenderness.         Left: Tenderness present. No mass.    Musculoskeletal:        General: Normal range of motion.     Cervical back: Normal range of motion.  Skin:    General: Skin is warm and dry.  Neurological:     General: No focal deficit present.     Mental Status: She is alert and oriented to person, place, and time.  Psychiatric:        Mood and Affect: Mood normal.        Behavior: Behavior normal.        Thought Content: Thought content normal.        Judgment: Judgment normal.     Results: Results for orders placed or performed in visit on 11/14/19 (from the past 24 hour(s))  POCT Wet Prep with KOH     Status: Abnormal   Collection Time: 11/14/19  1:48 PM  Result Value Ref Range   Trichomonas, UA Negative    Clue Cells Wet Prep HPF POC pos    Epithelial Wet Prep HPF POC     Yeast Wet Prep HPF POC neg    Bacteria Wet Prep HPF POC     RBC Wet Prep HPF POC     WBC Wet Prep HPF POC     KOH Prep POC Positive (A) Negative     Assessment/Plan: Bacterial vaginosis - Plan: metroNIDAZOLE (FLAGYL) 500 MG tablet, POCT Wet Prep with KOH; Pos sx/wet prep. Rx flagyl, No EtOH. Will RF if sx recur. F/u prn.   Screening for STD (sexually transmitted disease) - Plan: [facility] STD; Will f/u if pos.   Meds ordered this encounter  Medications  . metroNIDAZOLE (FLAGYL) 500 MG tablet    Sig: Take 1 tablet (500 mg total) by mouth 2 (two) times daily for 7 days.    Dispense:  14 tablet    Refill:  0    Order Specific Question:   Supervising Provider    Answer:   01/12/20 Nadara Mustard  Return if symptoms worsen or fail to improve.  Donato Studley B. Devynn Hessler, PA-C 11/14/2019 1:50  PM

## 2019-11-18 LAB — CERVICOVAGINAL ANCILLARY ONLY
Chlamydia: NEGATIVE
Comment: NEGATIVE
Comment: NORMAL
Neisseria Gonorrhea: NEGATIVE

## 2020-01-07 ENCOUNTER — Ambulatory Visit: Payer: Medicaid Other

## 2020-01-14 ENCOUNTER — Ambulatory Visit: Payer: Medicaid Other

## 2020-02-19 ENCOUNTER — Encounter: Payer: Self-pay | Admitting: Obstetrics and Gynecology

## 2020-02-20 ENCOUNTER — Other Ambulatory Visit: Payer: Self-pay | Admitting: Obstetrics and Gynecology

## 2020-02-20 DIAGNOSIS — B9689 Other specified bacterial agents as the cause of diseases classified elsewhere: Secondary | ICD-10-CM

## 2020-02-20 MED ORDER — METRONIDAZOLE 500 MG PO TABS: 500.0000 mg | ORAL_TABLET | Freq: Two times a day (BID) | ORAL | 0 refills | Status: AC

## 2020-02-20 NOTE — Progress Notes (Signed)
Rx RF flagyl for BV 

## 2020-06-09 ENCOUNTER — Other Ambulatory Visit: Payer: Self-pay

## 2020-06-09 ENCOUNTER — Encounter: Payer: Self-pay | Admitting: Obstetrics and Gynecology

## 2020-06-09 ENCOUNTER — Ambulatory Visit (INDEPENDENT_AMBULATORY_CARE_PROVIDER_SITE_OTHER): Payer: Medicaid Other | Admitting: Obstetrics and Gynecology

## 2020-06-09 ENCOUNTER — Other Ambulatory Visit (HOSPITAL_COMMUNITY)
Admission: RE | Admit: 2020-06-09 | Discharge: 2020-06-09 | Disposition: A | Discharge status: Home / Self Care | Payer: Medicaid Other | Source: Ambulatory Visit | Attending: Obstetrics and Gynecology | Admitting: Obstetrics and Gynecology

## 2020-06-09 VITALS — BP 110/70 | Ht 64.0 in | Wt 116.0 lb

## 2020-06-09 DIAGNOSIS — N921 Excessive and frequent menstruation with irregular cycle: Secondary | ICD-10-CM

## 2020-06-09 DIAGNOSIS — N760 Acute vaginitis: Secondary | ICD-10-CM

## 2020-06-09 DIAGNOSIS — Z113 Encounter for screening for infections with a predominantly sexual mode of transmission: Secondary | ICD-10-CM

## 2020-06-09 DIAGNOSIS — B9689 Other specified bacterial agents as the cause of diseases classified elsewhere: Secondary | ICD-10-CM | POA: Diagnosis not present

## 2020-06-09 LAB — POCT WET PREP WITH KOH
Clue Cells Wet Prep HPF POC: POSITIVE
KOH Prep POC: POSITIVE — AB
Trichomonas, UA: NEGATIVE
Yeast Wet Prep HPF POC: NEGATIVE

## 2020-06-09 MED ORDER — METRONIDAZOLE 500 MG PO TABS: 500.0000 mg | ORAL_TABLET | Freq: Two times a day (BID) | ORAL | 0 refills | Status: AC

## 2020-06-09 NOTE — Progress Notes (Signed)
Patient, No Pcp Per   Chief Complaint  Patient presents with  . STD testing    HPI:      [name] is a 29 y.o. [id number] whose LMP was No LMP recorded. Patient has had an injection., presents today for STD testing to be safe. No known exposures/sx. Has a new partner since neg STD testing 1/21, sometimes using condoms. Has had BTB (dark blood) with depo, due for 4th injection soon. Notices a bad odor with bleeding but no vag sx if not bleeding. Has occas postcoital bleeding and cramping with sex, no dyspareunia.  Hx of BV. Using dial soap, dryer sheets, thongs. No urin sx, LBP, pelvic pain, fevers.   Past Medical History:  Diagnosis Date  . Anxiety   . BV (bacterial vaginosis)   . Endometriosis   . Epilepsy ([facility])   . Seizures ([facility])     Past Surgical History:  Procedure Laterality Date  . NO PAST SURGERIES      Family History  Problem Relation Age of Onset  . Cancer Mother        lung  . Diabetes Father   . Heart disease Father   . Cancer Paternal Grandmother        cervical    Social History   Socioeconomic History  . Marital status: Married    Spouse name: Not on file  . Number of children: 2  . Years of education: 11  . Highest education level: Not on file  Occupational History  . Not on file  Tobacco Use  . Smoking status: Current Every Day Smoker    Packs/day: 0.50    Types: Cigarettes  . Smokeless tobacco: Never Used  Vaping Use  . Vaping Use: Former  Substance and Sexual Activity  . Alcohol use: Not Currently  . Drug use: No  . Sexual activity: Yes    Birth control/protection: None, Condom, Injection  Other Topics Concern  . 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 Difficulty of Paying Living Expenses:   Food Insecurity:   . Worried About Programme researcher, broadcasting/film/video in the Last Year:   . Barista in the Last Year:   Transportation  Needs:   . Freight forwarder (Medical):   Marland Kitchen Lack of Transportation (Non-Medical):   Physical Activity:   . Days of Exercise per Week:   . Minutes of Exercise per Session:   Stress:   . Feeling of Stress :   Social Connections:   . Frequency of Communication with Friends and Family:   . Frequency of Social Gatherings with Friends and Family:   . Attends Religious Services:   . Active Member of Clubs or Organizations:   . Attends Banker Meetings:   Marland Kitchen Marital Status:   Intimate Partner Violence:   . Fear of Current or Ex-Partner:   . Emotionally Abused:   Marland Kitchen Physically Abused:   . Sexually Abused:     Outpatient Medications Prior to Visit  Medication Sig Dispense Refill  . medroxyPROGESTERone Acetate 150 MG/ML SUSY Inject 1 mL (150 mg total) into the muscle once for 1 dose. 1 mL 2   No facility-administered medications prior to visit.      ROS:  Review of Systems  Constitutional: Negative for fever.  Gastrointestinal: Negative for blood in stool, constipation, diarrhea, nausea  and vomiting.  Genitourinary: Positive for dyspareunia, frequency, vaginal bleeding and vaginal discharge. Negative for dysuria, flank pain, hematuria, urgency and vaginal pain.  Musculoskeletal: Negative for back pain.  Skin: Negative for rash.   BREAST: No symptoms   OBJECTIVE:   Vitals:  BP 110/70   Ht 5\' 4"  (1.626 m)   Wt 116 lb (52.6 kg)   BMI 19.91 kg/m   Physical Exam Vitals reviewed.  Constitutional:      Appearance: She is well-developed.  Pulmonary:     Effort: Pulmonary effort is normal.  Genitourinary:    General: Normal vulva.     Pubic Area: No rash.      Labia:        Right: No rash, tenderness or lesion.        Left: No rash, tenderness or lesion.      Vagina: Normal. No vaginal discharge, erythema or tenderness.     Cervix: Normal.     Uterus: Normal. Not enlarged and not tender.      Adnexa: Right adnexa normal and left adnexa normal.        Right: No mass or tenderness.         Left: No mass or tenderness.    Musculoskeletal:        General: Normal range of motion.     Cervical back: Normal range of motion.  Skin:    General: Skin is warm and dry.  Neurological:     General: No focal deficit present.     Mental Status: She is alert and oriented to person, place, and time.  Psychiatric:        Mood and Affect: Mood normal.        Behavior: Behavior normal.        Thought Content: Thought content normal.        Judgment: Judgment normal.     Results: Results for orders placed or performed in visit on 06/09/20 (from the past 24 hour(s))  POCT Wet Prep with KOH     Status: Abnormal   Collection Time: 06/09/20  9:25 AM  Result Value Ref Range   Trichomonas, UA Negative    Clue Cells Wet Prep HPF POC pos    Epithelial Wet Prep HPF POC     Yeast Wet Prep HPF POC neg    Bacteria Wet Prep HPF POC     RBC Wet Prep HPF POC     WBC Wet Prep HPF POC     KOH Prep POC Positive (A) Negative     Assessment/Plan: Bacterial vaginosis - Plan: POCT Wet Prep with KOH, metroNIDAZOLE (FLAGYL) 500 MG tablet; Pos sx and wet prep. Rx flagyl, no EtOH. Add boric acid supp, use condoms, no thongs, dove sens skin soap, line dry underwear. F/u prn.   Screening for STD (sexually transmitted disease) - Plan: Cervicovaginal ancillary only  Breakthrough bleeding on depo provera--rule out STDs. If neg, should improve with next injection. F/u prn.    Meds ordered this encounter  Medications  . metroNIDAZOLE (FLAGYL) 500 MG tablet    Sig: Take 1 tablet (500 mg total) by mouth 2 (two) times daily for 7 days.    Dispense:  14 tablet    Refill:  0    Order Specific Question:   Supervising Provider    Answer:   08/09/20 Nadara Mustard      Return if symptoms worsen or fail to improve.  Machell Wirthlin B. Marquies Wanat, PA-C 06/09/2020 9:27 AM

## 2020-06-09 NOTE — Patient Instructions (Signed)
I value your feedback and entrusting us with your care. If you get a [facility] patient survey, I would appreciate you taking the time to let us know about your experience today. Thank you!  As of October 16, 2019, your lab results will be released to your MyChart immediately, before I even have a chance to see them. Please give me time to review them and contact you if there are any abnormalities. Thank you for your patience.  

## 2020-06-10 LAB — CERVICOVAGINAL ANCILLARY ONLY
Chlamydia: NEGATIVE
Comment: NEGATIVE
Comment: NORMAL
Neisseria Gonorrhea: NEGATIVE

## 2020-07-07 ENCOUNTER — Other Ambulatory Visit: Payer: Self-pay | Admitting: Obstetrics and Gynecology

## 2020-07-07 DIAGNOSIS — Z30013 Encounter for initial prescription of injectable contraceptive: Secondary | ICD-10-CM

## 2020-07-16 ENCOUNTER — Other Ambulatory Visit: Payer: Self-pay | Admitting: Obstetrics and Gynecology

## 2020-07-16 DIAGNOSIS — Z30013 Encounter for initial prescription of injectable contraceptive: Secondary | ICD-10-CM

## 2020-07-16 MED ORDER — MEDROXYPROGESTERONE ACETATE 150 MG/ML IM SUSY: 150.0000 mg | PREFILLED_SYRINGE | Freq: Once | INTRAMUSCULAR | 0 refills | Status: DC

## 2020-07-16 NOTE — Telephone Encounter (Signed)
Patient has scheduled AE apt for 09/09/20. Requesting refills to get to this apt.

## 2020-07-16 NOTE — Telephone Encounter (Signed)
Rx eRxd till annual.

## 2020-07-16 NOTE — Progress Notes (Signed)
Rx RF depo till annual 11/21

## 2020-07-19 ENCOUNTER — Ambulatory Visit (INDEPENDENT_AMBULATORY_CARE_PROVIDER_SITE_OTHER): Payer: Medicaid Other

## 2020-07-19 ENCOUNTER — Other Ambulatory Visit: Payer: Self-pay

## 2020-07-19 DIAGNOSIS — Z3042 Encounter for surveillance of injectable contraceptive: Secondary | ICD-10-CM

## 2020-07-19 DIAGNOSIS — Z3202 Encounter for pregnancy test, result negative: Secondary | ICD-10-CM

## 2020-07-19 LAB — POCT URINE PREGNANCY: Preg Test, Ur: NEGATIVE

## 2020-07-19 MED ORDER — MEDROXYPROGESTERONE ACETATE 150 MG/ML IM SUSP
150.0000 mg | Freq: Once | INTRAMUSCULAR | Status: AC
  Administered 2020-07-19: 150 mg via INTRAMUSCULAR

## 2020-07-19 NOTE — Progress Notes (Signed)
Pt here for depo which was given IM left deltoid after a negative urine preg test.  NDC# 02/04/2024

## 2020-08-17 ENCOUNTER — Other Ambulatory Visit: Payer: Self-pay

## 2020-08-17 ENCOUNTER — Encounter: Payer: Self-pay | Admitting: Obstetrics and Gynecology

## 2020-08-17 ENCOUNTER — Ambulatory Visit (INDEPENDENT_AMBULATORY_CARE_PROVIDER_SITE_OTHER): Payer: Medicaid Other | Admitting: Obstetrics and Gynecology

## 2020-08-17 ENCOUNTER — Other Ambulatory Visit (HOSPITAL_COMMUNITY)
Admission: RE | Admit: 2020-08-17 | Discharge: 2020-08-17 | Disposition: A | Discharge status: Home / Self Care | Payer: Medicaid Other | Source: Ambulatory Visit | Attending: Obstetrics and Gynecology | Admitting: Obstetrics and Gynecology

## 2020-08-17 VITALS — BP 90/60 | Ht 64.0 in | Wt 111.0 lb

## 2020-08-17 DIAGNOSIS — A599 Trichomoniasis, unspecified: Secondary | ICD-10-CM

## 2020-08-17 DIAGNOSIS — B373 Candidiasis of vulva and vagina: Secondary | ICD-10-CM | POA: Diagnosis not present

## 2020-08-17 DIAGNOSIS — B3731 Acute candidiasis of vulva and vagina: Secondary | ICD-10-CM

## 2020-08-17 DIAGNOSIS — Z113 Encounter for screening for infections with a predominantly sexual mode of transmission: Secondary | ICD-10-CM | POA: Diagnosis not present

## 2020-08-17 LAB — POCT WET PREP WITH KOH
Clue Cells Wet Prep HPF POC: NEGATIVE
KOH Prep POC: NEGATIVE
Trichomonas, UA: NEGATIVE
Yeast Wet Prep HPF POC: POSITIVE

## 2020-08-17 MED ORDER — FLUCONAZOLE 150 MG PO TABS: 150.0000 mg | ORAL_TABLET | Freq: Once | ORAL | 0 refills | Status: AC

## 2020-08-17 NOTE — Progress Notes (Signed)
Patient, No Pcp Per   Chief Complaint  Patient presents with  . STD testing    HPI:      [name] is a 29 y.o. [id number] whose LMP was No LMP recorded. Patient has had an injection., presents today for STD testing and issues with white, clumpy vag d/c with vag itching since yesterday. No meds to treat. No fishy odor. No LBP, fevers. Hx of recurrent BV, treated 8/21 with sx relief. Hasn't started boric acid supp yet. Would like STD testing. No known exposures/sx but sx feel similar to trich in past. Was also not with partner a couple wks so not sure if he had possible exposure. Neg STD testing 8/21 BTB with depo from 8/21 resolved   Past Medical History:  Diagnosis Date  . Anxiety   . BV (bacterial vaginosis)   . Endometriosis   . Epilepsy ([facility])   . Seizures ([facility])   . Trichomoniasis     Past Surgical History:  Procedure Laterality Date  . NO PAST SURGERIES      Family History  Problem Relation Age of Onset  . Cancer Mother        lung  . Diabetes Father   . Heart disease Father   . Cancer Paternal Grandmother        cervical    Social History   Socioeconomic History  . Marital status: Married    Spouse name: Not on file  . Number of children: 2  . Years of education: 86  . Highest education level: Not on file  Occupational History  . Not on file  Tobacco Use  . Smoking status: Current Every Day Smoker    Packs/day: 0.50    Types: Cigarettes  . Smokeless tobacco: Never Used  Vaping Use  . Vaping Use: Former  Substance and Sexual Activity  . Alcohol use: Not Currently  . Drug use: No  . Sexual activity: Yes    Birth control/protection: None, Injection  Other Topics Concern  . 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 Difficulty of Paying Living Expenses: Not on file  Food Insecurity:   . Worried About Programme researcher, broadcasting/film/video in the Last Year:  Not on file  . Ran Out of Food in the Last Year: Not on file  Transportation Needs:   . Lack of Transportation (Medical): Not on file  . Lack of Transportation (Non-Medical): Not on file  Physical Activity:   . Days of Exercise per Week: Not on file  . Minutes of Exercise per Session: Not on file  Stress:   . Feeling of Stress : Not on file  Social Connections:   . Frequency of Communication with Friends and Family: Not on file  . Frequency of Social Gatherings with Friends and Family: Not on file  . Attends Religious Services: Not on file  . Active Member of Clubs or Organizations: Not on file  . Attends Banker Meetings: Not on file  . Marital Status: Not on file  Intimate Partner Violence:   . Fear of Current or Ex-Partner: Not on file  . Emotionally Abused: Not on file  . Physically Abused: Not on file  . Sexually Abused: Not on file    Outpatient Medications Prior to Visit  Medication Sig Dispense Refill  . medroxyPROGESTERone Acetate 150 MG/ML SUSY Inject 1  mL (150 mg total) into the muscle once for 1 dose. 1 mL 0   No facility-administered medications prior to visit.      ROS:  Review of Systems  Constitutional: Negative for fever.  Gastrointestinal: Negative for blood in stool, constipation, diarrhea, nausea and vomiting.  Genitourinary: Positive for vaginal discharge. Negative for dyspareunia, dysuria, flank pain, frequency, hematuria, urgency, vaginal bleeding and vaginal pain.  Musculoskeletal: Negative for back pain.  Skin: Negative for rash.   BREAST: No symptoms   OBJECTIVE:   Vitals:  BP 90/60   Ht 5\' 4"  (1.626 m)   Wt 111 lb (50.3 kg)   BMI 19.05 kg/m   Physical Exam Vitals reviewed.  Constitutional:      Appearance: She is well-developed.  Pulmonary:     Effort: Pulmonary effort is normal.  Genitourinary:    General: Normal vulva.     Pubic Area: No rash.      Labia:        Right: No rash, tenderness or lesion.         Left: No rash, tenderness or lesion.      Vagina: Vaginal discharge present. No erythema or tenderness.     Cervix: Normal.     Uterus: Normal. Not enlarged and not tender.      Adnexa: Right adnexa normal and left adnexa normal.       Right: No mass or tenderness.         Left: No mass or tenderness.    Musculoskeletal:        General: Normal range of motion.     Cervical back: Normal range of motion.  Skin:    General: Skin is warm and dry.  Neurological:     General: No focal deficit present.     Mental Status: She is alert and oriented to person, place, and time.  Psychiatric:        Mood and Affect: Mood normal.        Behavior: Behavior normal.        Thought Content: Thought content normal.        Judgment: Judgment normal.     Results: Results for orders placed or performed in visit on 08/17/20 (from the past 24 hour(s))  POCT Wet Prep with KOH     Status: Abnormal   Collection Time: 08/17/20  4:52 PM  Result Value Ref Range   Trichomonas, UA Negative    Clue Cells Wet Prep HPF POC neg    Epithelial Wet Prep HPF POC     Yeast Wet Prep HPF POC pos    Bacteria Wet Prep HPF POC     RBC Wet Prep HPF POC     WBC Wet Prep HPF POC     KOH Prep POC Negative Negative     Assessment/Plan: Candidal vaginitis - Plan: POCT Wet Prep with KOH, fluconazole (DIFLUCAN) 150 MG tablet; pos sx and wet prep. Rx diflucan. F/u prn.   Screening for STD (sexually transmitted disease) - Plan: Cervicovaginal ancillary only    Meds ordered this encounter  Medications  . fluconazole (DIFLUCAN) 150 MG tablet    Sig: Take 1 tablet (150 mg total) by mouth once for 1 dose.    Dispense:  1 tablet    Refill:  0    Order Specific Question:   Supervising Provider    Answer:   10/17/20 Nadara Mustard      Return if symptoms worsen or fail to improve.  Jakya Dovidio B. Chesnie Capell, PA-C 08/17/2020 4:52 PM

## 2020-08-17 NOTE — Patient Instructions (Signed)
I value your feedback and entrusting us with your care. If you get a [facility] patient survey, I would appreciate you taking the time to let us know about your experience today. Thank you!  As of October 16, 2019, your lab results will be released to your MyChart immediately, before I even have a chance to see them. Please give me time to review them and contact you if there are any abnormalities. Thank you for your patience.  

## 2020-08-19 LAB — CERVICOVAGINAL ANCILLARY ONLY
Chlamydia: NEGATIVE
Comment: NEGATIVE
Comment: NEGATIVE
Comment: NORMAL
Neisseria Gonorrhea: NEGATIVE
Trichomonas: POSITIVE — AB

## 2020-08-19 MED ORDER — METRONIDAZOLE 500 MG PO TABS: ORAL_TABLET | ORAL | 0 refills | Status: DC

## 2020-08-19 NOTE — Addendum Note (Signed)
Addended by: Althea Grimmer B on: 08/19/2020 09:58 AM   Modules accepted: Orders

## 2020-09-09 ENCOUNTER — Ambulatory Visit: Payer: Medicaid Other | Admitting: Obstetrics and Gynecology

## 2020-09-09 DIAGNOSIS — A599 Trichomoniasis, unspecified: Secondary | ICD-10-CM | POA: Insufficient documentation

## 2020-09-09 DIAGNOSIS — R87610 Atypical squamous cells of undetermined significance on cytologic smear of cervix (ASC-US): Secondary | ICD-10-CM | POA: Insufficient documentation

## 2020-09-09 DIAGNOSIS — R87810 Cervical high risk human papillomavirus (HPV) DNA test positive: Secondary | ICD-10-CM | POA: Insufficient documentation

## 2020-09-09 NOTE — Progress Notes (Deleted)
PCP:  Patient, No Pcp Per   No chief complaint on file.    HPI:      [name] is a 29 y.o. [id number] whose LMP was No LMP recorded. Patient has had an injection., presents today for her annual examination.  Her menses are {norm/abn:715}, lasting {number:22536} days.  Dysmenorrhea {dysmen:716}. She {does:18564} have intermenstrual bleeding.  Sex activity: {sex active:315163}. depo Last Pap: 02/26/19  Results were: ASCUS with POSITIVE high risk HPV  Hx of STDs: trichomonas 9/21  There is no FH of breast cancer. There is no FH of ovarian cancer. The patient {does:18564} do self-breast exams.  Tobacco use: {tob:20664} Alcohol use: {Alcohol:11675} No drug use.  Exercise: {exercise:31265}  She {does:18564} get adequate calcium and Vitamin D in her diet.   The pregnancy intention screening data noted above was reviewed. Potential methods of contraception were discussed. The patient elected to proceed with {Upstream End Methods:24109}.     Past Medical History:  Diagnosis Date  . Anxiety   . BV (bacterial vaginosis)   . Endometriosis   . Epilepsy ([facility])   . Seizures ([facility])   . Trichomoniasis     Past Surgical History:  Procedure Laterality Date  . NO PAST SURGERIES      Family History  Problem Relation Age of Onset  . Cancer Mother        lung  . Diabetes Father   . Heart disease Father   . Cancer Paternal Grandmother        cervical    Social History   Socioeconomic History  . Marital status: Married    Spouse name: Not on file  . Number of children: 2  . Years of education: 57  . Highest education level: Not on file  Occupational History  . Not on file  Tobacco Use  . Smoking status: Current Every Day Smoker    Packs/day: 0.50    Types: Cigarettes  . Smokeless tobacco: Never Used  Vaping Use  . Vaping Use: Former  Substance and Sexual Activity  . Alcohol use: Not Currently  . Drug use: No  . Sexual activity: Yes    Birth  control/protection: None, Injection  Other Topics Concern  . 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 Difficulty of Paying Living Expenses: Not on file  Food Insecurity:   . Worried About Programme researcher, broadcasting/film/video in the Last Year: Not on file  . Ran Out of Food in the Last Year: Not on file  Transportation Needs:   . Lack of Transportation (Medical): Not on file  . Lack of Transportation (Non-Medical): Not on file  Physical Activity:   . Days of Exercise per Week: Not on file  . Minutes of Exercise per Session: Not on file  Stress:   . Feeling of Stress : Not on file  Social Connections:   . Frequency of Communication with Friends and Family: Not on file  . Frequency of Social Gatherings with Friends and Family: Not on file  . Attends Religious Services: Not on file  . Active Member of Clubs or Organizations: Not on file  . Attends Banker Meetings: Not on file  . Marital Status: Not on file  Intimate Partner Violence:   . Fear of Current or Ex-Partner: Not on file  . Emotionally Abused: Not on file  . Physically  Abused: Not on file  . Sexually Abused: Not on file     Current Outpatient Medications:  .  medroxyPROGESTERone Acetate 150 MG/ML SUSY, Inject 1 mL (150 mg total) into the muscle once for 1 dose., Disp: 1 mL, Rfl: 0 .  metroNIDAZOLE (FLAGYL) 500 MG tablet, Take 2 tabs BID for 1 day, Disp: 4 tablet, Rfl: 0     ROS:  Review of Systems BREAST: No symptoms   Objective: There were no vitals taken for this visit.   OBGyn Exam  Results: No results found for this or any previous visit (from the past 24 hour(s)).  Assessment/Plan: No diagnosis found.  No orders of the defined types were placed in this encounter.            GYN counsel {counseling:16159}     F/U  No follow-ups on file.  Fahima Cifelli B. Yanel Dombrosky, PA-C 09/09/2020 11:48 AM

## 2020-09-27 ENCOUNTER — Telehealth: Payer: Self-pay

## 2020-09-27 NOTE — Telephone Encounter (Signed)
Pt having pregnancy symptoms, but negative test. Wants to schedule an appt and figure out why she feels so bloated. Can you please schedule? I think she sees ABC.

## 2020-09-27 NOTE — Telephone Encounter (Signed)
We are scheduled 5 weeks out for NOB appointment. Would you be willing to order labs to confirm pregnancy?

## 2020-09-28 NOTE — Telephone Encounter (Signed)
Please advise 

## 2020-09-28 NOTE — Telephone Encounter (Signed)
Pls send to an OB provider in the office this wk. Thx

## 2020-10-04 NOTE — Telephone Encounter (Signed)
Called and offer to scheduled appointment. Patient report she has started her menstrual cycle and no longer needs an office visit

## 2020-10-11 ENCOUNTER — Other Ambulatory Visit: Payer: Self-pay | Admitting: Obstetrics and Gynecology

## 2020-10-11 ENCOUNTER — Telehealth: Payer: Self-pay | Admitting: Obstetrics and Gynecology

## 2020-10-11 ENCOUNTER — Other Ambulatory Visit: Payer: Self-pay

## 2020-10-11 ENCOUNTER — Ambulatory Visit: Payer: Medicaid Other

## 2020-10-11 DIAGNOSIS — Z30013 Encounter for initial prescription of injectable contraceptive: Secondary | ICD-10-CM

## 2020-10-11 MED ORDER — MEDROXYPROGESTERONE ACETATE 150 MG/ML IM SUSY: 150.0000 mg | PREFILLED_SYRINGE | Freq: Once | INTRAMUSCULAR | 0 refills | Status: DC

## 2020-10-11 NOTE — Telephone Encounter (Signed)
Rx already refilled today. She is past due for annual, needs to sched before next depo due. Thx.

## 2020-10-11 NOTE — Telephone Encounter (Signed)
Pt request call in Depo to [facility] at [address] AT [address] OF [place] . Pt has depo nurse visit appt tomorrow 10/12/20.

## 2020-10-11 NOTE — Telephone Encounter (Signed)
Patient had 3pm apt for Depo Provera injection today. She is out of refills. 1 refill sent. Patient is due for AE on/after 10/20/20-to schedule at Nurse visit apt tomorrow 10/12/20

## 2020-10-12 ENCOUNTER — Ambulatory Visit (INDEPENDENT_AMBULATORY_CARE_PROVIDER_SITE_OTHER): Payer: Medicaid Other

## 2020-10-12 ENCOUNTER — Other Ambulatory Visit: Payer: Self-pay

## 2020-10-12 DIAGNOSIS — Z3042 Encounter for surveillance of injectable contraceptive: Secondary | ICD-10-CM

## 2020-10-12 MED ORDER — MEDROXYPROGESTERONE ACETATE 150 MG/ML IM SUSP
150.0000 mg | Freq: Once | INTRAMUSCULAR | Status: AC
  Administered 2020-10-12: 150 mg via INTRAMUSCULAR

## 2020-10-12 NOTE — Progress Notes (Signed)
Patient presents today for Depo Provera injection within dates. Given IM LUOQ. Patient tolerated well. 

## 2020-10-12 NOTE — Telephone Encounter (Signed)
Pt aware.

## 2020-11-30 ENCOUNTER — Other Ambulatory Visit (HOSPITAL_COMMUNITY)
Admission: RE | Admit: 2020-11-30 | Discharge: 2020-11-30 | Disposition: A | Discharge status: Home / Self Care | Payer: Medicaid Other | Source: Ambulatory Visit | Attending: Obstetrics and Gynecology | Admitting: Obstetrics and Gynecology

## 2020-11-30 ENCOUNTER — Encounter: Payer: Self-pay | Admitting: Obstetrics and Gynecology

## 2020-11-30 ENCOUNTER — Other Ambulatory Visit: Payer: Self-pay

## 2020-11-30 ENCOUNTER — Ambulatory Visit (INDEPENDENT_AMBULATORY_CARE_PROVIDER_SITE_OTHER): Payer: Medicaid Other | Admitting: Obstetrics and Gynecology

## 2020-11-30 VITALS — BP 104/60 | Ht 64.0 in | Wt 130.0 lb

## 2020-11-30 DIAGNOSIS — R87610 Atypical squamous cells of undetermined significance on cytologic smear of cervix (ASC-US): Secondary | ICD-10-CM | POA: Diagnosis present

## 2020-11-30 DIAGNOSIS — Z113 Encounter for screening for infections with a predominantly sexual mode of transmission: Secondary | ICD-10-CM

## 2020-11-30 DIAGNOSIS — Z0000 Encounter for general adult medical examination without abnormal findings: Secondary | ICD-10-CM

## 2020-11-30 DIAGNOSIS — Z01419 Encounter for gynecological examination (general) (routine) without abnormal findings: Secondary | ICD-10-CM | POA: Diagnosis not present

## 2020-11-30 DIAGNOSIS — Z3042 Encounter for surveillance of injectable contraceptive: Secondary | ICD-10-CM

## 2020-11-30 DIAGNOSIS — R87810 Cervical high risk human papillomavirus (HPV) DNA test positive: Secondary | ICD-10-CM | POA: Insufficient documentation

## 2020-11-30 DIAGNOSIS — Z1151 Encounter for screening for human papillomavirus (HPV): Secondary | ICD-10-CM | POA: Insufficient documentation

## 2020-11-30 DIAGNOSIS — A599 Trichomoniasis, unspecified: Secondary | ICD-10-CM | POA: Diagnosis present

## 2020-11-30 DIAGNOSIS — Z124 Encounter for screening for malignant neoplasm of cervix: Secondary | ICD-10-CM

## 2020-11-30 MED ORDER — MEDROXYPROGESTERONE ACETATE 150 MG/ML IM SUSY: 150.0000 mg | PREFILLED_SYRINGE | Freq: Once | INTRAMUSCULAR | 3 refills | Status: DC

## 2020-11-30 NOTE — Progress Notes (Signed)
PCP:  Patient, No Pcp Per   Chief Complaint  Patient presents with  . Gynecologic Exam    No concerns     HPI:      [name] is a 30 y.o. [id number] whose LMP was No LMP recorded. Patient has had an injection., presents today for her annual examination. Her menses are absent with depo. Dysmenorrhea mild, occas. She does have occas intermenstrual bleeding. Hx of endometriosis  Sex activity: single partner, contraception - Depo-Provera injections.  Last Pap: 02/26/19  Results were: ASCUS with POSITIVE high risk HPV ; repeat due today Hx of STDs: trichomonas, positive 4/20 and 10/21; due for TOC today. No longer with that partner.  Hx of BV and yeast vag in past, no sx currently.  There is no FH of breast cancer. There is no FH of ovarian cancer. The patient does do self-breast exams.  Tobacco use: 1/2 ppd, wants to quit Alcohol use: none No drug use.  Exercise: moderately active  She does get adequate calcium but not Vitamin D in her diet.   Past Medical History:  Diagnosis Date  . Anxiety   . BV (bacterial vaginosis)   . Endometriosis   . Epilepsy (HCC)   . Seizures (HCC)   . Trichomoniasis     Past Surgical History:  Procedure Laterality Date  . NO PAST SURGERIES      Family History  Problem Relation Age of Onset  . Cancer Mother        lung  . Diabetes Father   . Heart disease Father   . Cancer Paternal Grandmother        cervical    Social History   Socioeconomic History  . Marital status: Married    Spouse name: Not on file  . Number of children: 2  . Years of education: 67  . Highest education level: Not on file  Occupational History  . Not on file  Tobacco Use  . Smoking status: Current Every Day Smoker    Packs/day: 0.50    Types: Cigarettes  . Smokeless tobacco: Never Used  Vaping Use  . Vaping Use: Former  Substance and Sexual Activity  . Alcohol use: Not Currently  . Drug use: No  . Sexual activity: Yes    Birth  control/protection: None, Injection  Other Topics Concern  . 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  medroxyPROGESTERone Acetate 150 MG/ML SUSY, Inject 1 mL (150 mg total) into the muscle once for 1 dose., Disp: 1 mL, Rfl: 3     ROS:  Review of Systems  Constitutional: Negative for fatigue, fever and unexpected weight change.  Respiratory: Negative for cough, shortness of breath and wheezing.   Cardiovascular: Negative for chest pain, palpitations and leg swelling.  Gastrointestinal: Negative for blood in stool, constipation, diarrhea, nausea and vomiting.  Endocrine: Negative for cold intolerance, heat intolerance and polyuria.  Genitourinary: Negative for dyspareunia, dysuria, flank pain, frequency, genital sores, hematuria, menstrual problem, pelvic pain, urgency, vaginal bleeding, vaginal discharge and vaginal pain.  Musculoskeletal: Negative for back pain, joint swelling and myalgias.  Skin: Negative for rash.  Neurological: Negative for dizziness, syncope, light-headedness, numbness and headaches.  Hematological: Negative for adenopathy.  Psychiatric/Behavioral: Negative for agitation, confusion, sleep disturbance and suicidal ideas. The patient is not nervous/anxious.    BREAST: tenderness   Objective: BP 104/60   Ht 5\' 4"  (1.626 m)   Wt 130 lb (59 kg)   BMI 22.31 kg/m    Physical Exam Constitutional:      Appearance: She is well-developed.  Genitourinary:     Vulva normal.     Right Labia: No rash, tenderness or lesions.    Left Labia: No tenderness, lesions or rash.    No vaginal discharge, erythema or tenderness.       Right Adnexa: not tender and no mass present.    Left Adnexa: not tender and no mass present.    No cervical friability or polyp.     Uterus is not enlarged or tender.  Breasts:     Right: No mass, nipple discharge, skin change or tenderness.     Left: No mass, nipple discharge, skin change or tenderness.    Neck:     Thyroid: No thyromegaly.  Cardiovascular:     Rate and Rhythm: Normal rate and regular rhythm.     Heart sounds: Normal heart sounds. No murmur heard.   Pulmonary:     Effort: Pulmonary effort is normal.     Breath sounds: Normal breath sounds.  Abdominal:     Palpations: Abdomen is soft.     Tenderness: There is no abdominal tenderness. There is no guarding or rebound.  Musculoskeletal:        General: Normal range of motion.     Cervical back: Normal range of motion.  Lymphadenopathy:     Cervical: No cervical adenopathy.  Neurological:     General: No focal deficit present.     Mental Status: She is alert and oriented to person, place, and time.     Cranial Nerves: No cranial nerve deficit.  Skin:    General: Skin is warm and dry.  Psychiatric:        Mood and Affect: Mood normal.        Behavior: Behavior normal.        Thought Content: Thought content normal.        Judgment: Judgment normal.  Vitals reviewed.    Assessment/Plan: Encounter for annual routine gynecological examination  Cervical cancer screening - Plan: Cytology - PAP  Screening for STD (sexually transmitted disease) - Plan: Cytology - PAP  Trichomoniasis - Plan: Cytology - PAP; TOC today  Screening for HPV (human papillomavirus) - Plan: Cytology - PAP  ASCUS with positive high risk HPV cervical - Plan: Cytology - PAP; repeat today, will f/u if abn  Encounter for surveillance of injectable contraceptive - Plan: medroxyPROGESTERone Acetate 150 MG/ML SUSY; Rx RF depo. Cont ca/add Vit D  Meds ordered this encounter  Medications  . medroxyPROGESTERone Acetate 150 MG/ML SUSY     Sig: Inject 1 mL (150 mg total) into the muscle once for 1 dose.    Dispense:  1 mL    Refill:  3    Order Specific Question:   Supervising Provider    Answer:   Nadara Mustard             GYN counsel adequate intake of calcium and vitamin D, diet and exercise     F/U  Return in about 1 year (around 11/30/2021).  Ciearra Rufo B. Laurel Harnden,  PA-C 11/30/2020 3:46 PM

## 2020-11-30 NOTE — Patient Instructions (Signed)
I value your feedback and you entrusting us with your care. If you get a [facility] patient survey, I would appreciate you taking the time to let us know about your experience today. Thank you! ? ? ?

## 2020-12-02 LAB — CYTOLOGY - PAP
Chlamydia: NEGATIVE
Comment: NEGATIVE
Comment: NEGATIVE
Comment: NEGATIVE
Comment: NORMAL
Diagnosis: NEGATIVE
High risk HPV: NEGATIVE
Neisseria Gonorrhea: NEGATIVE
Trichomonas: NEGATIVE

## 2021-01-04 ENCOUNTER — Ambulatory Visit: Payer: Medicaid Other

## 2021-01-06 ENCOUNTER — Ambulatory Visit: Payer: Medicaid Other

## 2021-01-10 ENCOUNTER — Ambulatory Visit (INDEPENDENT_AMBULATORY_CARE_PROVIDER_SITE_OTHER): Payer: Medicaid Other

## 2021-01-10 ENCOUNTER — Other Ambulatory Visit: Payer: Self-pay

## 2021-01-10 DIAGNOSIS — Z3042 Encounter for surveillance of injectable contraceptive: Secondary | ICD-10-CM

## 2021-01-10 MED ORDER — MEDROXYPROGESTERONE ACETATE 150 MG/ML IM SUSP
150.0000 mg | Freq: Once | INTRAMUSCULAR | Status: AC
  Administered 2021-01-10: 150 mg via INTRAMUSCULAR

## 2021-01-10 NOTE — Progress Notes (Signed)
Patient presents today for Depo Provera injection within dates. Given IM Right Deltoid per patient request. Patient tolerated well.

## 2021-04-11 ENCOUNTER — Other Ambulatory Visit: Payer: Self-pay

## 2021-04-11 ENCOUNTER — Ambulatory Visit (INDEPENDENT_AMBULATORY_CARE_PROVIDER_SITE_OTHER): Payer: Medicaid Other

## 2021-04-11 DIAGNOSIS — Z3042 Encounter for surveillance of injectable contraceptive: Secondary | ICD-10-CM | POA: Diagnosis not present

## 2021-04-11 MED ORDER — MEDROXYPROGESTERONE ACETATE 150 MG/ML IM SUSP
150.0000 mg | Freq: Once | INTRAMUSCULAR | Status: AC
  Administered 2021-04-11: 150 mg via INTRAMUSCULAR

## 2021-04-11 NOTE — Progress Notes (Signed)
Pt here for depo which was given IM left deltoid.  NDC# [id number] 

## 2021-05-30 ENCOUNTER — Encounter: Payer: Self-pay | Admitting: Obstetrics and Gynecology

## 2021-05-30 ENCOUNTER — Other Ambulatory Visit: Payer: Self-pay

## 2021-05-30 ENCOUNTER — Ambulatory Visit (INDEPENDENT_AMBULATORY_CARE_PROVIDER_SITE_OTHER): Payer: Medicaid Other | Admitting: Obstetrics and Gynecology

## 2021-05-30 VITALS — BP 110/70 | Ht 64.0 in | Wt 128.0 lb

## 2021-05-30 DIAGNOSIS — R102 Pelvic and perineal pain: Secondary | ICD-10-CM | POA: Diagnosis not present

## 2021-05-30 DIAGNOSIS — N921 Excessive and frequent menstruation with irregular cycle: Secondary | ICD-10-CM | POA: Diagnosis not present

## 2021-05-30 NOTE — Patient Instructions (Signed)
I value your feedback and you entrusting us with your care. If you get a [facility] patient survey, I would appreciate you taking the time to let us know about your experience today. Thank you! ? ? ?

## 2021-05-30 NOTE — Progress Notes (Signed)
Patient, No Pcp Per (Inactive)   Chief Complaint  Patient presents with   Pelvic Pain    Entire area, during intercourse x 4-5 days    HPI:      [name] is a 30 y.o. [id number] whose LMP was No LMP recorded. Patient has had an injection., presents today for pelvic pain for a few days. Sx started as stabbing pains with IC and then pt started with BTB with depo the next day. Pain is intermittent throbbing if not sex active. Hasn't tried NSAIDs/heating pad for pain. Hx of endometriosis, controlled with depo in the past. Pain unusual for pt but does have occas BTB with depo. Has also had intermittent LBP. No urin sx, no vag sx. Hx of dymen with loose stools and nausea since she "has been sober". No fevers. Next depo due 8/22. She is sex active, no new partners. Neg pap/neg STD testing 1/22.  Past Medical History:  Diagnosis Date   Anxiety    BV (bacterial vaginosis)    Endometriosis    Epilepsy (HCC)    Seizures (HCC)    Trichomoniasis     Past Surgical History:  Procedure Laterality Date   NO PAST SURGERIES      Family History  Problem Relation Age of Onset   Cancer Mother        lung   Diabetes Father    Heart disease Father    Cancer Paternal Grandmother        cervical    Social History   Socioeconomic History   Marital status: Married    Spouse name: Not on file   Number of children: 2   Years of education: 12   Highest education level: Not on file  Occupational History   Not on file  Tobacco Use   Smoking status: Every Day    Packs/day: 0.50    Types: Cigarettes   Smokeless tobacco: Never  Vaping Use   Vaping Use: Former  Substance and Sexual Activity   Alcohol use: Not Currently   Drug use: No   Sexual activity: Yes    Birth control/protection: Injection  Other Topics Concern   Not on file  Social History Narrative   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 1 mL 3   No facility-administered medications prior to visit.      ROS:  Review of Systems  Constitutional:  Positive for fever.  Gastrointestinal:  Positive for diarrhea and nausea. Negative for blood in stool, constipation and vomiting.  Genitourinary:  Positive for dyspareunia, pelvic pain and vaginal bleeding. Negative for dysuria, flank pain, frequency, hematuria, urgency, vaginal discharge and vaginal pain.  Musculoskeletal:  Negative for back pain.  Skin:  Negative for rash.  BREAST: No symptoms   OBJECTIVE:   Vitals:  BP 110/70   Ht 5\' 4"  (1.626 m)   Wt 128 lb (58.1 kg)   BMI 21.97 kg/m   Physical Exam Vitals reviewed.  Constitutional:      Appearance:  She is well-developed.  Pulmonary:     Effort: Pulmonary effort is normal.  Abdominal:     Palpations: Abdomen is soft.     Tenderness: There is abdominal tenderness in the right lower quadrant, suprapubic area and left lower quadrant. There is no right CVA tenderness, left CVA tenderness, guarding or rebound.  Genitourinary:    General: Normal vulva.     Pubic Area: No rash.      Labia:        Right: No rash, tenderness or lesion.        Left: No rash, tenderness or lesion.      Vagina: Bleeding present. No vaginal discharge, erythema or tenderness.     Cervix: No cervical motion tenderness.     Uterus: Normal. Tender. Not enlarged.      Adnexa:        Right: Tenderness present. No mass.         Left: Tenderness present. No mass.    Musculoskeletal:        General: Normal range of motion.     Cervical back: Normal range of motion.  Skin:     General: Skin is warm and dry.  Neurological:     General: No focal deficit present.     Mental Status: She is alert and oriented to person, place, and time.  Psychiatric:        Mood and Affect: Mood normal.        Behavior: Behavior normal.        Thought Content: Thought content normal.        Judgment: Judgment normal.    Assessment/Plan: Pelvic pain - Plan: US PELVIC COMPLETE WITH TRANSVAGINAL; pos exam, no new partners. No CMT.  On depo. Question pain due to hx of endometriosis and BTB with depo. NSAIDs/heating pad. Sched GYN u/s. If sx worsen, will do STAT u/s due to u/s shortage. If sx resolve, pt to cancel u/s. Will f/u with results.   Breakthrough bleeding on depo provera--reassurance. Next depo due 8/22.      Return if symptoms worsen or fail to improve.  Ameris Akamine B. Argenis Kumari, PA-C 05/30/2021 4:40 PM

## 2021-06-15 ENCOUNTER — Other Ambulatory Visit: Payer: Self-pay

## 2021-06-15 ENCOUNTER — Ambulatory Visit
Admission: RE | Admit: 2021-06-15 | Discharge: 2021-06-15 | Disposition: A | Discharge status: Home / Self Care | Payer: Medicaid Other | Source: Ambulatory Visit | Attending: Obstetrics and Gynecology | Admitting: Obstetrics and Gynecology

## 2021-06-15 DIAGNOSIS — R102 Pelvic and perineal pain: Secondary | ICD-10-CM | POA: Diagnosis present

## 2021-07-04 ENCOUNTER — Ambulatory Visit: Payer: Medicaid Other

## 2021-07-13 ENCOUNTER — Ambulatory Visit (INDEPENDENT_AMBULATORY_CARE_PROVIDER_SITE_OTHER): Payer: Medicaid Other

## 2021-07-13 ENCOUNTER — Other Ambulatory Visit: Payer: Self-pay

## 2021-07-13 DIAGNOSIS — N912 Amenorrhea, unspecified: Secondary | ICD-10-CM

## 2021-07-13 DIAGNOSIS — Z3202 Encounter for pregnancy test, result negative: Secondary | ICD-10-CM | POA: Diagnosis not present

## 2021-07-13 DIAGNOSIS — Z3042 Encounter for surveillance of injectable contraceptive: Secondary | ICD-10-CM

## 2021-07-13 LAB — POCT URINE PREGNANCY: Preg Test, Ur: NEGATIVE

## 2021-07-13 MED ORDER — MEDROXYPROGESTERONE ACETATE 150 MG/ML IM SUSP
150.0000 mg | Freq: Once | INTRAMUSCULAR | Status: AC
  Administered 2021-07-13: 150 mg via INTRAMUSCULAR

## 2021-07-13 NOTE — Progress Notes (Signed)
Patient presents today for Depo Provera injection outside of date range. Patient not currently on menses. Pregnancy test performed per protocol (negative).Given IM LUOQ. Patient tolerated well. 

## 2021-10-05 ENCOUNTER — Ambulatory Visit (INDEPENDENT_AMBULATORY_CARE_PROVIDER_SITE_OTHER): Payer: Medicaid Other

## 2021-10-05 ENCOUNTER — Other Ambulatory Visit: Payer: Self-pay

## 2021-10-05 DIAGNOSIS — Z3042 Encounter for surveillance of injectable contraceptive: Secondary | ICD-10-CM | POA: Diagnosis not present

## 2021-10-05 DIAGNOSIS — Z30019 Encounter for initial prescription of contraceptives, unspecified: Secondary | ICD-10-CM

## 2021-10-05 MED ORDER — MEDROXYPROGESTERONE ACETATE 150 MG/ML IM SUSP
150.0000 mg | Freq: Once | INTRAMUSCULAR | Status: AC
  Administered 2021-10-05: 150 mg via INTRAMUSCULAR

## 2021-12-21 ENCOUNTER — Ambulatory Visit: Payer: Medicaid Other

## 2022-03-01 ENCOUNTER — Ambulatory Visit (LOCAL_COMMUNITY_HEALTH_CENTER): Payer: Medicaid Other

## 2022-03-01 VITALS — BP 118/82 | HR 94 | Ht 64.0 in | Wt 133.5 lb

## 2022-03-01 DIAGNOSIS — Z3202 Encounter for pregnancy test, result negative: Secondary | ICD-10-CM

## 2022-03-01 LAB — PREGNANCY, URINE: Preg Test, Ur: NEGATIVE

## 2022-03-01 NOTE — Progress Notes (Signed)
Per client, has had one home UPT with faint positive result around 1500 yesterday and home UPT this am was negative. UPT result today negative. Reports has had some right lower side pain and encouraged evaluation by [facility] as they are her OB / GYN provider. Jossie Ng, RN ? ? ?

## 2022-07-13 ENCOUNTER — Other Ambulatory Visit (HOSPITAL_COMMUNITY)
Admission: RE | Admit: 2022-07-13 | Discharge: 2022-07-13 | Disposition: A | Discharge status: Home / Self Care | Payer: Medicaid Other | Source: Ambulatory Visit | Attending: Obstetrics and Gynecology | Admitting: Obstetrics and Gynecology

## 2022-07-13 ENCOUNTER — Encounter: Payer: Self-pay | Admitting: Obstetrics and Gynecology

## 2022-07-13 ENCOUNTER — Ambulatory Visit (INDEPENDENT_AMBULATORY_CARE_PROVIDER_SITE_OTHER): Payer: Medicaid Other | Admitting: Obstetrics and Gynecology

## 2022-07-13 VITALS — BP 90/70 | Ht 64.0 in | Wt 128.0 lb

## 2022-07-13 DIAGNOSIS — N939 Abnormal uterine and vaginal bleeding, unspecified: Secondary | ICD-10-CM | POA: Insufficient documentation

## 2022-07-13 DIAGNOSIS — N809 Endometriosis, unspecified: Secondary | ICD-10-CM | POA: Diagnosis not present

## 2022-07-13 DIAGNOSIS — R102 Pelvic and perineal pain: Secondary | ICD-10-CM

## 2022-07-13 LAB — POCT URINE PREGNANCY: Preg Test, Ur: NEGATIVE

## 2022-07-13 NOTE — Progress Notes (Signed)
Patient, No Pcp Per   Chief Complaint  Patient presents with   Vaginal Bleeding    Hasn't stopped bleeding since 8/17, sharp pain at times in pelvic area    HPI:      [name] is a 31 y.o. [id number] whose LMP was Patient's last menstrual period was 06/22/2022 (exact date)., presents today for irregular bleeding since 8/17. Last depo given 11/22. Pt has resumed menses monthly, lasting 3-5 days, very light, no BTB, no dysmen. Menses started on time 8/17, lasting 3-5 days, then had 1 day off and then heavier bleeding started and hasn't stopped. Pt wears period panties occas, but flow not enough for her to wear pads/tampons. Having increased dysmen, not taking any meds. Pain is stabbing at times, causing n//hot flashes, also with diarrhea. Hx of endometriosis. Current pain similar to menses in past prior to depo use. Stopped depo to conceive. Had pos UPT 6/23 and 7/23 and then started period next day, neg UPTs afterwards. Taking PNVs.  She is sexually active, no pain/bleeding. No new sex partners.  Neg pap 1/22  Patient Active Problem List   Diagnosis Date Noted   Trichomoniasis 09/09/2020   ASCUS with positive high risk HPV cervical 09/09/2020   Bacterial vaginosis 06/09/2020   Tobacco use during pregnancy, antepartum 09/04/2019   History of abnormal cervical Pap smear 04/16/2019   Closed fracture of great toe of right foot 05/10/2017   Substance abuse (HCC) 01/08/2017   Past Medical History:  Diagnosis Date   Anxiety    BV (bacterial vaginosis)    Endometriosis    Endometriosis    Epilepsy (HCC)    History of spontaneous abortion    x3   Seizures (HCC)    Trichomoniasis      Past Surgical History:  Procedure Laterality Date   elective abortion     2021    Family History  Problem Relation Age of Onset   Cancer Mother        lung   Diabetes Father    Heart disease Father    Cancer Paternal Grandmother        cervical    Social History    Socioeconomic History   Marital status: Legally Separated    Spouse name: Not on file   Number of children: 2   Years of education: 12   Highest education level: High school graduate  Occupational History   Not on file  Tobacco Use   Smoking status: Every Day    Packs/day: 0.50    Types: Cigarettes    Passive exposure: Current   Smokeless tobacco: Never  Vaping Use   Vaping Use: Former  Substance and Sexual Activity   Alcohol use: Yes    Comment: Last ETOH use 02/24/2022.   Drug use: Not Currently    Comment: Last marijuana use 5 years ago.   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   Social Determinants of Health   Financial Resource Strain: Not on file  Food Insecurity: Not on file  Transportation Needs: Not on file  Physical Activity: Not on file  Stress: Not on file  Social Connections: Not on file  Intimate Partner Violence: Not At Risk (03/01/2022)   Humiliation, Afraid, Rape, and Kick questionnaire    Fear of Current or Ex-Partner: No    Emotionally Abused: No  Physically Abused: No    Sexually Abused: No    Outpatient Medications Prior to Visit  Medication Sig Dispense Refill   prenatal vitamin w/FE, FA (NATACHEW) 29-1 MG CHEW chewable tablet Chew 1 tablet by mouth daily at 12 noon. Gummy prenatal vitamin.     medroxyPROGESTERone Acetate 150 MG/ML SUSY Inject 1 mL (150 mg total) into the muscle once for 1 dose. 1 mL 3   No facility-administered medications prior to visit.      ROS:  Review of Systems  Constitutional:  Negative for fever.  Gastrointestinal:  Positive for diarrhea and nausea. Negative for blood in stool, constipation and vomiting.  Genitourinary:  Positive for menstrual problem and pelvic pain. Negative for dyspareunia, dysuria, flank pain, frequency, hematuria, urgency, vaginal bleeding, vaginal discharge and vaginal pain.   Musculoskeletal:  Negative for back pain.  Skin:  Negative for rash.   BREAST: No symptoms   OBJECTIVE:   Vitals:  BP 90/70   Ht 5\' 4"  (1.626 m)   Wt 128 lb (58.1 kg)   LMP 06/22/2022 (Exact Date)   BMI 21.97 kg/m   Physical Exam Vitals reviewed.  Constitutional:      Appearance: She is well-developed.  Pulmonary:     Effort: Pulmonary effort is normal.  Abdominal:     Palpations: Abdomen is soft.     Tenderness: There is no abdominal tenderness. There is no guarding or rebound.  Genitourinary:    General: Normal vulva.     Pubic Area: No rash.      Labia:        Right: No rash, tenderness or lesion.        Left: No rash, tenderness or lesion.      Vagina: Bleeding present. No vaginal discharge, erythema or tenderness.     Cervix: Normal.     Uterus: Normal. Not enlarged and not tender.      Adnexa: Right adnexa normal and left adnexa normal.       Right: No mass or tenderness.         Left: No mass or tenderness.    Musculoskeletal:        General: Normal range of motion.     Cervical back: Normal range of motion.  Skin:    General: Skin is warm and dry.  Neurological:     General: No focal deficit present.     Mental Status: She is alert and oriented to person, place, and time.  Psychiatric:        Mood and Affect: Mood normal.        Behavior: Behavior normal.        Thought Content: Thought content normal.        Judgment: Judgment normal.     Results: Results for orders placed or performed in visit on 07/13/22 (from the past 24 hour(s))  POCT urine pregnancy     Status: Normal   Collection Time: 07/13/22  6:01 PM  Result Value Ref Range   Preg Test, Ur Negative Negative     Assessment/Plan: Pelvic pain - Plan: 09/12/22 PELVIS TRANSVAGINAL NON-OB (TV ONLY), Cervicovaginal ancillary only; with menses, hx of endometriosis. Neg exam, neg UPT. Rule out STDs, check GYN u/s. If neg, then sx related to endometriosis. Recommended NSAIDs and not to suffer through  pain.   Abnormal uterine bleeding (AUB) - Plan: US PELVIS TRANSVAGINAL NON-OB (TV ONLY), Cervicovaginal ancillary only, POCT urine pregnancy; since coming off depo. Neg UPT, rule out StDs and check  GYN u/s. If neg, need to give depo time to fully get out of sx. F/u prn.   Endometriosis    Return in about 1 week (around 07/20/2022) for GYN u/s for pelvic pain/AUB--ABc to call pt.  Laurence Crofford B. Zhaniya Swallows, PA-C 07/13/2022 6:02 PM

## 2022-07-13 NOTE — Patient Instructions (Signed)
I value your feedback and you entrusting us with your care. If you get a [facility] patient survey, I would appreciate you taking the time to let us know about your experience today. Thank you! ? ? ?

## 2022-07-17 LAB — CERVICOVAGINAL ANCILLARY ONLY
Chlamydia: NEGATIVE
Comment: NEGATIVE
Comment: NORMAL
Neisseria Gonorrhea: NEGATIVE

## 2022-07-27 ENCOUNTER — Ambulatory Visit: Payer: Medicaid Other | Admitting: Obstetrics and Gynecology

## 2022-08-04 ENCOUNTER — Ambulatory Visit
Admission: RE | Admit: 2022-08-04 | Discharge: 2022-08-04 | Disposition: A | Discharge status: Home / Self Care | Payer: Medicaid Other | Source: Ambulatory Visit | Attending: Obstetrics and Gynecology | Admitting: Obstetrics and Gynecology

## 2022-08-04 DIAGNOSIS — N939 Abnormal uterine and vaginal bleeding, unspecified: Secondary | ICD-10-CM | POA: Diagnosis present

## 2022-08-04 DIAGNOSIS — R102 Pelvic and perineal pain: Secondary | ICD-10-CM | POA: Diagnosis not present

## 2023-02-01 ENCOUNTER — Encounter: Payer: Self-pay | Admitting: Obstetrics and Gynecology

## 2023-02-01 ENCOUNTER — Ambulatory Visit (INDEPENDENT_AMBULATORY_CARE_PROVIDER_SITE_OTHER): Payer: Medicaid Other | Admitting: Obstetrics and Gynecology

## 2023-02-01 VITALS — BP 90/68 | Ht 64.0 in | Wt 116.0 lb

## 2023-02-01 DIAGNOSIS — N6321 Unspecified lump in the left breast, upper outer quadrant: Secondary | ICD-10-CM | POA: Diagnosis not present

## 2023-02-01 NOTE — Progress Notes (Signed)
Patient, No Pcp Per   Chief Complaint  Patient presents with   Breast Exam    Lump on LB, oddly shaped, not tender/painful, skin peeled off in LB x 2 days    HPI:      Betty Powell is a 32 y.o. [id number] whose LMP was Patient's last menstrual period was 12/24/2022 (exact date)., presents today for new LT breast mas noticed 2 days ago when randomly touching breast. No redness, no tenderness to touch; just occas feels sore. No trauma, no nipple d/c. No fevers. FH breast cancer in her [place]. No hx of breast masses, no prior imaging done. Noticed faint skin peeling around LT areola a few days ago, no sx today.    Patient Active Problem List   Diagnosis Date Noted   Trichomoniasis 09/09/2020   ASCUS with positive high risk HPV cervical 09/09/2020   Bacterial vaginosis 06/09/2020   Tobacco use during pregnancy, antepartum 09/04/2019   History of abnormal cervical Pap smear 04/16/2019   Closed fracture of great toe of right foot 05/10/2017   Substance abuse ([place]) 01/08/2017    Past Surgical History:  Procedure Laterality Date   elective abortion     2021    Family History  Problem Relation Age of Onset   Lung cancer Mother    Diabetes Father    Heart disease Father    Cervical cancer Paternal Grandmother        61s   Breast cancer Maternal Great-grandmother 70    Social History   Socioeconomic History   Marital status: Legally Separated    Spouse name: Not on file   Number of children: 2   Years of education: 12   Highest education level: High school graduate  Occupational History   Not on file  Tobacco Use   Smoking status: Every Day    Packs/day: .5    Types: Cigarettes    Passive exposure: Current   Smokeless tobacco: Never  Vaping Use   Vaping Use: Former  Substance and Sexual Activity   Alcohol use: Yes    Comment: Last ETOH use 02/24/2022.   Drug use: Not Currently    Comment: Last marijuana use 5 years ago.   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   Social Determinants of Health   Financial Resource Strain: Not on file  Food Insecurity: Not on file  Transportation Needs: Not on file  Physical Activity: Not on file  Stress: Not on file  Social Connections: Not on file  Intimate Partner Violence: Not At Risk (03/01/2022)   Humiliation, Afraid, Rape, and Kick questionnaire    Fear of Current or Ex-Partner: No    Emotionally Abused: No    Physically Abused: No    Sexually Abused: No    Outpatient Medications Prior to Visit  Medication Sig Dispense Refill   prenatal vitamin w/FE, FA (NATACHEW) 29-1 MG CHEW chewable tablet Chew 1 tablet by mouth daily at 12 noon. Gummy prenatal vitamin.     No facility-administered medications prior to visit.      ROS:  Review of Systems  Constitutional:  Negative for fever.  Gastrointestinal:  Negative for blood in stool, constipation, diarrhea, nausea and vomiting.  Genitourinary:  Negative for dyspareunia, dysuria, flank pain, frequency, hematuria, urgency, vaginal bleeding, vaginal discharge and vaginal pain.  Musculoskeletal:  Negative  for back pain.  Skin:  Negative for rash.   BREAST: mass   OBJECTIVE:   Vitals:  BP 90/68   Ht 5\' 4"  (1.626 m)   Wt 116 lb (52.6 kg)   LMP 12/24/2022 (Exact Date)   BMI 19.91 kg/m   Physical Exam Vitals reviewed.  Pulmonary:     Effort: Pulmonary effort is normal.  Chest:  Breasts:    Breasts are symmetrical.     Right: No inverted nipple, mass, nipple discharge, skin change or tenderness.     Left: Mass present. No inverted nipple, nipple discharge, skin change or tenderness.    Musculoskeletal:        General: Normal range of motion.     Cervical back: Normal range of motion.  Skin:    General: Skin is warm and dry.  Neurological:     General: No focal deficit present.     Mental Status: Betty Powell is alert and  oriented to person, place, and time.     Cranial Nerves: No cranial nerve deficit.  Psychiatric:        Mood and Affect: Mood normal.        Behavior: Behavior normal.        Thought Content: Thought content normal.        Judgment: Judgment normal.     Assessment/Plan: Mass of upper outer quadrant of left breast - Plan: [place] LIMITED ULTRASOUND INCLUDING AXILLA LEFT BREAST , [place] LIMITED ULTRASOUND INCLUDING AXILLA RIGHT BREAST, MM 3D DIAGNOSTIC MAMMOGRAM BILATERAL BREAST; check dx mammo and u/s, pt to call to schedule. Will f/u with results.    Return if symptoms worsen or fail to improve.  Tonyetta Berko B. Makaleigh Reinard, PA-C 02/01/2023 9:48 AM

## 2023-02-01 NOTE — Patient Instructions (Signed)
I value your feedback and you entrusting us with your care. If you get a [facility] patient survey, I would appreciate you taking the time to let us know about your experience today. Thank you! ? ? ?

## 2023-02-09 ENCOUNTER — Ambulatory Visit
Admission: RE | Admit: 2023-02-09 | Discharge: 2023-02-09 | Disposition: A | Discharge status: Home / Self Care | Payer: Medicaid Other | Source: Ambulatory Visit | Attending: Obstetrics and Gynecology | Admitting: Obstetrics and Gynecology

## 2023-02-09 DIAGNOSIS — N6321 Unspecified lump in the left breast, upper outer quadrant: Secondary | ICD-10-CM

## 2023-02-12 ENCOUNTER — Other Ambulatory Visit: Payer: Self-pay | Admitting: Obstetrics and Gynecology

## 2023-02-12 DIAGNOSIS — N63 Unspecified lump in unspecified breast: Secondary | ICD-10-CM

## 2023-02-12 DIAGNOSIS — R928 Other abnormal and inconclusive findings on diagnostic imaging of breast: Secondary | ICD-10-CM

## 2023-02-14 ENCOUNTER — Ambulatory Visit
Admission: RE | Admit: 2023-02-14 | Discharge: 2023-02-14 | Disposition: A | Discharge status: Home / Self Care | Payer: Medicaid Other | Source: Ambulatory Visit | Attending: Obstetrics and Gynecology | Admitting: Obstetrics and Gynecology

## 2023-02-14 DIAGNOSIS — R928 Other abnormal and inconclusive findings on diagnostic imaging of breast: Secondary | ICD-10-CM | POA: Insufficient documentation

## 2023-02-14 DIAGNOSIS — N63 Unspecified lump in unspecified breast: Secondary | ICD-10-CM

## 2023-02-14 HISTORY — PX: BREAST BIOPSY: SHX20

## 2023-02-14 MED ORDER — LIDOCAINE-EPINEPHRINE 1 %-1:100000 IJ SOLN
10.0000 mL | Freq: Once | INTRAMUSCULAR | Status: AC
  Administered 2023-02-14: 10 mL via INTRADERMAL
  Filled 2023-02-14: qty 10

## 2023-02-14 MED ORDER — LIDOCAINE HCL (PF) 1 % IJ SOLN
5.0000 mL | Freq: Once | INTRAMUSCULAR | Status: AC
  Administered 2023-02-14: 5 mL via INTRADERMAL
  Filled 2023-02-14: qty 5

## 2023-02-15 LAB — SURGICAL PATHOLOGY

## 2023-07-16 ENCOUNTER — Ambulatory Visit (LOCAL_COMMUNITY_HEALTH_CENTER): Payer: Medicaid Other

## 2023-07-16 VITALS — BP 113/55 | Ht 64.0 in | Wt 113.0 lb

## 2023-07-16 DIAGNOSIS — Z3201 Encounter for pregnancy test, result positive: Secondary | ICD-10-CM | POA: Diagnosis not present

## 2023-07-16 DIAGNOSIS — Z309 Encounter for contraceptive management, unspecified: Secondary | ICD-10-CM

## 2023-07-16 MED ORDER — PRENATAL 27-0.8 MG PO TABS
1.0000 | ORAL_TABLET | Freq: Every day | ORAL | Status: DC
Start: 2023-07-16 — End: 2023-08-24

## 2023-07-16 NOTE — Progress Notes (Signed)
UPT positive. Plans prenatal care at [facility]; patient encouraged to schedule prenatal appt ASAP.  The patient was dispensed prenatal vitamins #100 today. I provided counseling today regarding the medication. We discussed the medication, the side effects and when to call clinic.   Positive pregnancy packet reviewed and given to patient. Also counseled on hydration and when to seek medical attention.   Patient given the opportunity to ask questions. Questions answered.    Abagail Kitchens, RN

## 2023-07-17 ENCOUNTER — Encounter: Payer: Self-pay | Admitting: Obstetrics and Gynecology

## 2023-07-17 LAB — PREGNANCY, URINE: Preg Test, Ur: POSITIVE — AB

## 2023-07-27 ENCOUNTER — Ambulatory Visit (INDEPENDENT_AMBULATORY_CARE_PROVIDER_SITE_OTHER): Payer: Medicaid Other

## 2023-07-27 VITALS — Wt 113.0 lb

## 2023-07-27 DIAGNOSIS — Z369 Encounter for antenatal screening, unspecified: Secondary | ICD-10-CM

## 2023-07-27 DIAGNOSIS — Z3689 Encounter for other specified antenatal screening: Secondary | ICD-10-CM

## 2023-07-27 DIAGNOSIS — Z3480 Encounter for supervision of other normal pregnancy, unspecified trimester: Secondary | ICD-10-CM | POA: Insufficient documentation

## 2023-07-27 NOTE — Progress Notes (Signed)
New OB Intake  I connected with  Betty Powell on 07/27/23 at 10:15 AM EDT by telephone Video Visit and verified that I am speaking with the correct person using two identifiers. Nurse is located at [facility] and pt is located at her Mom's.  I explained I am completing New OB Intake today. We discussed her EDD of 03/11/2024 that is based on LMP of 06/05/2023. Pt is G7/P2042. I reviewed her allergies, medications, Medical/Surgical/OB history, and appropriate screenings. There are cats in the home. FOB cleans litter box.  Based on history, this is a/an pregnancy uncomplicated .   Patient Active Problem List   Diagnosis Date Noted   Supervision of other normal pregnancy, antepartum 07/27/2023   Tobacco use during pregnancy, antepartum 09/04/2019   History of abnormal cervical Pap smear 04/16/2019    Concerns addressed today Is debating on water birth; pt aware our providers are not certified for this; pt aware she would need to go to [place].  Delivery Plans:  Plans to deliver at [facility] - if decides against water birth.  Anatomy Korea Explained first scheduled [place] will be scheduled soon and an anatomy scan will be done at 20 weeks.  Labs Discussed genetic screening with patient. Patient desires genetic testing to be drawn at new OB visit. Discussed possible labs to be drawn at new OB appointment.  COVID Vaccine Patient has not had COVID vaccine.   Social Determinants of Health Food Insecurity: expresses food insecurity. Information given on local food banks. Transportation: Patient denies transportation needs. Childcare: Discussed no children allowed at ultrasound appointments.   First visit review I reviewed new OB appt with pt. I explained she will have ob bloodwork and pap smear/pelvic exam if indicated. Explained pt will be seen by an AOB Provider at first visit; encounter routed to appropriate provider.   Loran Senters, [place] 07/27/2023  11:02 AM

## 2023-07-27 NOTE — Patient Instructions (Signed)
First Trimester of Pregnancy  The first trimester of pregnancy starts on the first day of your last menstrual period until the end of week 12. This is also called months 1 through 3 of pregnancy. Body changes during your first trimester Your body goes through many changes during pregnancy. The changes usually return to normal after your baby is born. Physical changes You may gain or lose weight. Your breasts may grow larger and hurt. The area around your nipples may get darker. Dark spots or blotches may develop on your face. You may have changes in your hair. Health changes You may feel like you might vomit (nauseous), and you may vomit. You may have heartburn. You may have headaches. You may have trouble pooping (constipation). Your gums may bleed. Other changes You may get tired easily. You may pee (urinate) more often. Your menstrual periods will stop. You may not feel hungry. You may want to eat certain kinds of food. You may have changes in your emotions from day to day. You may have more dreams. Follow these instructions at home: Medicines Take over-the-counter and prescription medicines only as told by your doctor. Some medicines are not safe during pregnancy. Take a prenatal vitamin that contains at least 600 micrograms (mcg) of folic acid. Eating and drinking Eat healthy meals that include: Fresh fruits and vegetables. Whole grains. Good sources of protein, such as meat, eggs, or tofu. Low-fat dairy products. Avoid raw meat and unpasteurized juice, milk, and cheese. If you feel like you may vomit, or you vomit: Eat 4 or 5 small meals a day instead of 3 large meals. Try eating a few soda crackers. Drink liquids between meals instead of during meals. You may need to take these actions to prevent or treat trouble pooping: Drink enough fluids to keep your pee (urine) pale yellow. Eat foods that are high in fiber. These include beans, whole grains, and fresh fruits and  vegetables. Limit foods that are high in fat and sugar. These include fried or sweet foods. Activity Exercise only as told by your doctor. Most people can do their usual exercise routine during pregnancy. Stop exercising if you have cramps or pain in your lower belly (abdomen) or low back. Do not exercise if it is too hot or too humid, or if you are in a place of great height (high altitude). Avoid heavy lifting. If you choose to, you may have sex unless your doctor tells you not to. Relieving pain and discomfort Wear a good support bra if your breasts are sore. Rest with your legs raised (elevated) if you have leg cramps or low back pain. If you have bulging veins (varicose veins) in your legs: Wear support hose as told by your doctor. Raise your feet for 15 minutes, 3-4 times a day. Limit salt in your food. Safety Wear your seat belt at all times when you are in a car. Talk with your doctor if someone is hurting you or yelling at you. Talk with your doctor if you are feeling sad or have thoughts of hurting yourself. Lifestyle Do not use hot tubs, steam rooms, or saunas. Do not douche. Do not use tampons or scented sanitary pads. Do not use herbal medicines, illegal drugs, or medicines that are not approved by your doctor. Do not drink alcohol. Do not smoke or use any products that contain nicotine or tobacco. If you need help quitting, ask your doctor. Avoid cat litter boxes and soil that is used by cats. These carry   germs that can cause harm to the baby and can cause a loss of your baby by miscarriage or stillbirth. General instructions Keep all follow-up visits. This is important. Ask for help if you need counseling or if you need help with nutrition. Your doctor can give you advice or tell you where to go for help. Visit your dentist. At home, brush your teeth with a soft toothbrush. Floss gently. Write down your questions. Take them to your prenatal visits. Where to find more  information American Pregnancy Association: americanpregnancy.org American College of Obstetricians and Gynecologists: www.acog.org Office on Women's Health: womenshealth.gov/pregnancy Contact a doctor if: You are dizzy. You have a fever. You have mild cramps or pressure in your lower belly. You have a nagging pain in your belly area. You continue to feel like you may vomit, you vomit, or you have watery poop (diarrhea) for 24 hours or longer. You have a bad-smelling fluid coming from your vagina. You have pain when you pee. You are exposed to a disease that spreads from person to person, such as chickenpox, measles, Zika virus, HIV, or hepatitis. Get help right away if: You have spotting or bleeding from your vagina. You have very bad belly cramping or pain. You have shortness of breath or chest pain. You have any kind of injury, such as from a fall or a car crash. You have new or increased pain, swelling, or redness in an arm or leg. Summary The first trimester of pregnancy starts on the first day of your last menstrual period until the end of week 12 (months 1 through 3). Eat 4 or 5 small meals a day instead of 3 large meals. Do not smoke or use any products that contain nicotine or tobacco. If you need help quitting, ask your doctor. Keep all follow-up visits. This information is not intended to replace advice given to you by your health care provider. Make sure you discuss any questions you have with your health care provider. Document Revised: 03/31/2020 Document Reviewed: 02/05/2020 Elsevier Patient Education  2024 Elsevier Inc. Commonly Asked Questions During Pregnancy  Cats: A parasite can be excreted in cat feces.  To avoid exposure you need to have another person empty the little box.  If you must empty the litter box you will need to wear gloves.  Wash your hands after handling your cat.  This parasite can also be found in raw or undercooked meat so this should also be  avoided.  Colds, Sore Throats, Flu: Please check your medication sheet to see what you can take for symptoms.  If your symptoms are unrelieved by these medications please call the office.  Dental Work: Most any dental work your dentist recommends is permitted.  X-rays should only be taken during the first trimester if absolutely necessary.  Your abdomen should be shielded with a lead apron during all x-rays.  Please notify your provider prior to receiving any x-rays.  Novocaine is fine; gas is not recommended.  If your dentist requires a note from us prior to dental work please call the office and we will provide one for you.  Exercise: Exercise is an important part of staying healthy during your pregnancy.  You may continue most exercises you were accustomed to prior to pregnancy.  Later in your pregnancy you will most likely notice you have difficulty with activities requiring balance like riding a bicycle.  It is important that you listen to your body and avoid activities that put you at a higher   risk of falling.  Adequate rest and staying well hydrated are a must!  If you have questions about the safety of specific activities ask your provider.    Exposure to Children with illness: Try to avoid obvious exposure; report any symptoms to us when noted,  If you have chicken pos, red measles or mumps, you should be immune to these diseases.   Please do not take any vaccines while pregnant unless you have checked with your OB provider.  Fetal Movement: After 28 weeks we recommend you do "kick counts" twice daily.  Lie or sit down in a calm quiet environment and count your baby movements "kicks".  You should feel your baby at least 10 times per hour.  If you have not felt 10 kicks within the first hour get up, walk around and have something sweet to eat or drink then repeat for an additional hour.  If count remains less than 10 per hour notify your provider.  Fumigating: Follow your pest control agent's  advice as to how long to stay out of your home.  Ventilate the area well before re-entering.  Hemorrhoids:   Most over-the-counter preparations can be used during pregnancy.  Check your medication to see what is safe to use.  It is important to use a stool softener or fiber in your diet and to drink lots of liquids.  If hemorrhoids seem to be getting worse please call the office.   Hot Tubs:  Hot tubs Jacuzzis and saunas are not recommended while pregnant.  These increase your internal body temperature and should be avoided.  Intercourse:  Sexual intercourse is safe during pregnancy as long as you are comfortable, unless otherwise advised by your provider.  Spotting may occur after intercourse; report any bright red bleeding that is heavier than spotting.  Labor:  If you know that you are in labor, please go to the hospital.  If you are unsure, please call the office and let us help you decide what to do.  Lifting, straining, etc:  If your job requires heavy lifting or straining please check with your provider for any limitations.  Generally, you should not lift items heavier than that you can lift simply with your hands and arms (no back muscles)  Painting:  Paint fumes do not harm your pregnancy, but may make you ill and should be avoided if possible.  Latex or water based paints have less odor than oils.  Use adequate ventilation while painting.  Permanents & Hair Color:  Chemicals in hair dyes are not recommended as they cause increase hair dryness which can increase hair loss during pregnancy.  " Highlighting" and permanents are allowed.  Dye may be absorbed differently and permanents may not hold as well during pregnancy.  Sunbathing:  Use a sunscreen, as skin burns easily during pregnancy.  Drink plenty of fluids; avoid over heating.  Tanning Beds:  Because their possible side effects are still unknown, tanning beds are not recommended.  Ultrasound Scans:  Routine ultrasounds are performed  at approximately 20 weeks.  You will be able to see your baby's general anatomy an if you would like to know the gender this can usually be determined as well.  If it is questionable when you conceived you may also receive an ultrasound early in your pregnancy for dating purposes.  Otherwise ultrasound exams are not routinely performed unless there is a medical necessity.  Although you can request a scan we ask that you pay for it when   conducted because insurance does not cover " patient request" scans.  Work: If your pregnancy proceeds without complications you may work until your due date, unless your physician or employer advises otherwise.  Round Ligament Pain/Pelvic Discomfort:  Sharp, shooting pains not associated with bleeding are fairly common, usually occurring in the second trimester of pregnancy.  They tend to be worse when standing up or when you remain standing for long periods of time.  These are the result of pressure of certain pelvic ligaments called "round ligaments".  Rest, Tylenol and heat seem to be the most effective relief.  As the womb and fetus grow, they rise out of the pelvis and the discomfort improves.  Please notify the office if your pain seems different than that described.  It may represent a more serious condition.  Common Medications Safe in Pregnancy  Acne:      Constipation:  Benzoyl Peroxide     Colace  Clindamycin      Dulcolax Suppository  Topica Erythromycin     Fibercon  Salicylic Acid      Metamucil         Miralax AVOID:        Senakot   Accutane    Cough:  Retin-A       Cough Drops  Tetracycline      Phenergan w/ Codeine if Rx  Minocycline      Robitussin (Plain & DM)  Antibiotics:     Crabs/Lice:  Ceclor       RID  Cephalosporins    AVOID:  E-Mycins      Kwell  Keflex  Macrobid/Macrodantin   Diarrhea:  Penicillin      Kao-Pectate  Zithromax      Imodium AD         PUSH FLUIDS AVOID:       Cipro     Fever:  Tetracycline      Tylenol (Regular  or Extra  Minocycline       Strength)  Levaquin      Extra Strength-Do not          Exceed 8 tabs/24 hrs Caffeine:        <200mg/day (equiv. To 1 cup of coffee or  approx. 3 12 oz sodas)         Gas: Cold/Hayfever:       Gas-X  Benadryl      Mylicon  Claritin       Phazyme  **Claritin-D        Chlor-Trimeton    Headaches:  Dimetapp      ASA-Free Excedrin  Drixoral-Non-Drowsy     Cold Compress  Mucinex (Guaifenasin)     Tylenol (Regular or Extra  Sudafed/Sudafed-12 Hour     Strength)  **Sudafed PE Pseudoephedrine   Tylenol Cold & Sinus     Vicks Vapor Rub  Zyrtec  **AVOID if Problems With Blood Pressure         Heartburn: Avoid lying down for at least 1 hour after meals  Aciphex      Maalox     Rash:  Milk of Magnesia     Benadryl    Mylanta       1% Hydrocortisone Cream  Pepcid  Pepcid Complete   Sleep Aids:  Prevacid      Ambien   Prilosec       Benadryl  Rolaids       Chamomile Tea  Tums (Limit 4/day)     Unisom           Tylenol PM         Warm milk-add vanilla or  Hemorrhoids:       Sugar for taste  Anusol/Anusol H.C.  (RX: Analapram 2.5%)  Sugar Substitutes:  Hydrocortisone OTC     Ok in moderation  Preparation H      Tucks        Vaseline lotion applied to tissue with wiping    Herpes:     Throat:  Acyclovir      Oragel  Famvir  Valtrex     Vaccines:         Flu Shot Leg Cramps:       *Gardasil  Benadryl      Hepatitis A         Hepatitis B Nasal Spray:       Pneumovax  Saline Nasal Spray     Polio Booster         Tetanus Nausea:       Tuberculosis test or PPD  Vitamin B6 25 mg TID   AVOID:    Dramamine      *Gardasil  Emetrol       Live Poliovirus  Ginger Root 250 mg QID    MMR (measles, mumps &  High Complex Carbs @ Bedtime    rebella)  Sea Bands-Accupressure    Varicella (Chickenpox)  Unisom 1/2 tab TID     *No known complications           If received before Pain:         Known pregnancy;   Darvocet       Resume series  after  Lortab        Delivery  Percocet    Yeast:   Tramadol      Femstat  Tylenol 3      Gyne-lotrimin  Ultram       Monistat  Vicodin           MISC:         All Sunscreens           Hair Coloring/highlights          Insect Repellant's          (Including DEET)         Mystic Tans  

## 2023-08-14 ENCOUNTER — Ambulatory Visit: Payer: Medicaid Other | Admitting: Obstetrics and Gynecology

## 2023-08-14 ENCOUNTER — Ambulatory Visit: Payer: Medicaid Other

## 2023-08-14 ENCOUNTER — Encounter: Payer: Self-pay | Admitting: Obstetrics and Gynecology

## 2023-08-14 VITALS — BP 118/72 | HR 86 | Resp 16 | Ht 64.0 in | Wt 116.7 lb

## 2023-08-14 DIAGNOSIS — Z3A01 Less than 8 weeks gestation of pregnancy: Secondary | ICD-10-CM

## 2023-08-14 DIAGNOSIS — F17210 Nicotine dependence, cigarettes, uncomplicated: Secondary | ICD-10-CM | POA: Diagnosis not present

## 2023-08-14 DIAGNOSIS — Z3481 Encounter for supervision of other normal pregnancy, first trimester: Secondary | ICD-10-CM | POA: Diagnosis not present

## 2023-08-14 DIAGNOSIS — Z369 Encounter for antenatal screening, unspecified: Secondary | ICD-10-CM

## 2023-08-14 DIAGNOSIS — O99330 Smoking (tobacco) complicating pregnancy, unspecified trimester: Secondary | ICD-10-CM

## 2023-08-14 DIAGNOSIS — O020 Blighted ovum and nonhydatidiform mole: Secondary | ICD-10-CM

## 2023-08-14 DIAGNOSIS — Z3480 Encounter for supervision of other normal pregnancy, unspecified trimester: Secondary | ICD-10-CM

## 2023-08-14 NOTE — Progress Notes (Signed)
GYNECOLOGY PROGRESS NOTE  Subjective:    Patient ID: Betty Powell, female    DOB: 12-08-1990, 32 y.o.   MRN: [id number]  HPI  Patient is a 32 y.o. [id number] female at [redacted]w[redacted]d with last menstrual period was 06/05/2023 (exact date), Estimated Date of Delivery: 03/11/24, who presents for follow up after ultrasound. She had dating ultrasound performed today and it was found to be concerning for a non viable pregnancy (blighted ovum at ~ [redacted] weeks gestation.  Denies any vaginal bleeding or cramping. Patient's mother and FOB in room today.   Of note, patient has a history of several miscarriages in the past, also with 2 living children. History of endometriosis. Does have a h/o tobacco use, trying to quit.  Has cut down from 1 ppd to 4-5 cig/day.    The following portions of the patient's history were reviewed and updated as appropriate:   OB History  Gravida Para Term Preterm AB Living  7 2 2  0 4 2  SAB IAB Ectopic Multiple Live Births  3 0 0 0 2    # Outcome Date GA Lbr Len/2nd Weight Sex Type Anes PTL Lv  7 Current           6 AB 09/17/19 [redacted]w[redacted]d         5 Term 02/17/17 [redacted]w[redacted]d 03:50 / 00:06 8 lb (3.63 kg) M Vag-Spont None  LIV     Birth Comments: no anomalies observed at delivery  4 Term 01/08/12 [redacted]w[redacted]d  6 lb 7 oz (2.92 kg) M Vag-Spont  N LIV  3 SAB 2012     SAB     2 SAB 2012     SAB     1 SAB 2011     SAB       She  has a past medical history of Anxiety, BV (bacterial vaginosis), Endometriosis, Endometriosis, Epilepsy (HCC), History of spontaneous abortion, Seizures (HCC), and Trichomoniasis.  She  has a past surgical history that includes elective abortion; Breast biopsy (Right, 02/14/2023); Breast biopsy (Right, 02/14/2023); and Wisdom tooth extraction.  She  reports that she has been smoking cigarettes. She has been exposed to tobacco smoke. She has never used smokeless tobacco. She reports that she does not currently use alcohol. She reports that she does not currently use drugs  after having used the following drugs: Marijuana and Other-see comments.  Current Outpatient Medications on File Prior to Visit  Medication Sig Dispense Refill   Prenatal Vit-Fe Fumarate-FA (MULTIVITAMIN-PRENATAL) 27-0.8 MG TABS tablet Take 1 tablet by mouth daily at 12 noon.     No current facility-administered medications on file prior to visit.   She has No Known Allergies..  Review of Systems Pertinent items noted in HPI and remainder of comprehensive ROS otherwise negative.   Objective:   Blood pressure 118/72, pulse 86, resp. rate 16, height 5\' 4"  (1.626 m), weight 116 lb 11.2 oz (52.9 kg), last menstrual period 06/05/2023.  Body mass index is 20.03 kg/m. General appearance: alert and no distress Abdomen: soft, non-tender; bowel sounds normal; no masses,  no organomegaly Pelvic: deferred    Imaging:  ULTRASOUND REPORT   Location: [facility] Date of Service: 08/14/2023    Indications:dating Findings:  Mason Jim intrauterine pregnancy is visualized with a GS consistent with [redacted]w[redacted]d gestation, giving an (U/S) EDD of 03/26/2024. The (U/S) EDD is not consistent with the clinically established EDD of 03/11/2024.   FHR: not visualized CRL measurement: not embryo visualized Yolk sac  is not visualized. Amnion: vizualized and appears abnormal    Right Ovary is normal in appearance. Left Ovary is normal appearance. Corpus luteal cyst:  is not visualized Survey of the adnexa demonstrates no adnexal masses. There is no free peritoneal fluid in the cul de sac.   Impression: 1. [redacted]w[redacted]d nonviable Singleton Intrauterine pregnancy by U/S. 2. (U/S) EDD is not consistent with Clinically established EDD of 03/26/2024.   Recommendations: 1.Clinical correlation with the patient's History and Physical Exam.     Waldo Laine, RT   Assessment:   1. Blighted ovum   2. Tobacco use during pregnancy, antepartum      Plan:   1. Blighted ovum Discussion had with  patient regarding findings of likely blighted ovum at [redacted] weeks gestation, vs incorrect dating with LMP.  Advised on recommendations for management based on this. If dating is correct, can proceed with missed AB management (expectant vs cytotec vs surgical with D&C). If incorrect, can f/u with serial BHCG levels and repeat US in 1 week. Patient fairly certain of her dates, however does desire to manage expectantly at this time and would like to have hormone levels checked, as she has had miscarriages in the past. Will honor request ans perform serial BHCGs, initial performed today, to f/u in 2 days for repeat lab, and will notify patient by phone/Mychart once resulted. Will determine follow up based on results. All questions answered.  - Beta hCG quant (ref lab); Standing   2. Tobacco use during pregnancy, antepartum - Continued to encourage patient on smoking cessation, has significantly cut down. Informed that this will also decrease risk factors in pregnancy such as preterm birth and low birth weight.     A total of 37 minutes were spent during this encounter, including review of previous progress notes, recent imaging and labs, face-to-face with time with patient involving counseling and coordination of care, as well as documentation for current visit.  Hildred Laser, MD [facility]

## 2023-08-15 LAB — BETA HCG QUANT (REF LAB): hCG Quant: 83602 m[IU]/mL

## 2023-08-16 ENCOUNTER — Other Ambulatory Visit: Payer: Medicaid Other

## 2023-08-16 DIAGNOSIS — O020 Blighted ovum and nonhydatidiform mole: Secondary | ICD-10-CM

## 2023-08-17 ENCOUNTER — Telehealth: Payer: Self-pay

## 2023-08-17 LAB — BETA HCG QUANT (REF LAB): hCG Quant: 76888 m[IU]/mL

## 2023-08-17 NOTE — Telephone Encounter (Signed)
Called pt to make her aware so she can review Dr. Oretha Milch msg.

## 2023-08-17 NOTE — Telephone Encounter (Signed)
Pt called triage, saw beta results and that #'s are going down. Would like to know what next step would be. Advised msg will be sent to Dr. Valentino Saxon.

## 2023-08-17 NOTE — Telephone Encounter (Signed)
Sent patient a response in Mychart.

## 2023-08-22 ENCOUNTER — Ambulatory Visit: Payer: Medicaid Other | Admitting: Obstetrics and Gynecology

## 2023-08-22 ENCOUNTER — Encounter: Payer: Self-pay | Admitting: Obstetrics and Gynecology

## 2023-08-22 VITALS — BP 112/64 | HR 79 | Resp 16 | Ht 64.0 in | Wt 119.9 lb

## 2023-08-22 DIAGNOSIS — O020 Blighted ovum and nonhydatidiform mole: Secondary | ICD-10-CM

## 2023-08-22 DIAGNOSIS — Z3A01 Less than 8 weeks gestation of pregnancy: Secondary | ICD-10-CM

## 2023-08-22 DIAGNOSIS — N96 Recurrent pregnancy loss: Secondary | ICD-10-CM

## 2023-08-22 NOTE — Patient Instructions (Signed)
GYNECOLOGY PRE-OPERATIVE INSTRUCTIONS  You are scheduled for surgery on 08/24/2023.  The name of your procedure is: Suction Dilation and Curettage.   Please read through these instructions carefully regarding preparation for your surgery: Nothing to eat after midnight on the day prior to surgery.  Do not take any medications unless recommended by your provider on day prior to surgery.  Do not take NSAIDs (Motrin, Aleve) or aspirin 7 days prior to surgery.  You may take Tylenol products for minor aches and pains.  You will receive a prescription for pain medications post-operatively.  You will be contacted by phone approximately 1-2 weeks prior to surgery to schedule your pre-operative appointment.  You will need someone to drive you to and from your surgery.  You will not be allowed to drive for 24 hours after your surgical procedure. Please call the office if you have any questions regarding your upcoming surgery.    Thank you for choosing [facility].

## 2023-08-22 NOTE — Progress Notes (Signed)
GYNECOLOGY PREOPERATIVE HISTORY AND PHYSICAL   Subjective:  Betty Powell is a 32 y.o. [phone] here for surgical management of missed abortion at [redacted] weeks gestation (blighted ovum).  Patient has a history of pregnancy loss, however has also had 2 successful pregnancies as well.  She has previously been counseled on all management options for this miscarriage including medical versus surgical, and has decided on surgical management. No significant preoperative concerns.  Patient desires repeat ultrasound today, "just to be sure".   Proposed surgery: Suction dilation and curettage   Pertinent Gynecological History: Menses: History of irregular menses, and endometriosis. Bleeding: None Contraception: none Last pap: normal Date: 11/30/2020   Past Medical History:  Diagnosis Date   Anxiety    BV (bacterial vaginosis)    Endometriosis    Epilepsy (HCC)    History of spontaneous abortion    x3   Seizures (HCC)    Trichomoniasis     Past Surgical History:  Procedure Laterality Date   BREAST BIOPSY Right 02/14/2023   [place] Bx, Venus Clip, path pending   BREAST BIOPSY Right 02/14/2023   [place] RT BREAST BX W LOC DEV 1ST LESION IMG BX SPEC US GUIDE 02/14/2023 [facility]   elective abortion     2021   WISDOM TOOTH EXTRACTION      OB History  Gravida Para Term Preterm AB Living  7 2 2  0 4 2  SAB IAB Ectopic Multiple Live Births  3 0 0 0 2    # Outcome Date GA Lbr Len/2nd Weight Sex Type Anes PTL Lv  7 Current           6 AB 09/17/19 [redacted]w[redacted]d         5 Term 02/17/17 [redacted]w[redacted]d 03:50 / 00:06 8 lb (3.63 kg) M Vag-Spont None  LIV     Birth Comments: no anomalies observed at delivery  4 Term 01/08/12 [redacted]w[redacted]d  6 lb 7 oz (2.92 kg) M Vag-Spont  N LIV  3 SAB 2012     SAB     2 SAB 2012     SAB     1 SAB 2011     SAB       Family History  Problem Relation Age of Onset   Hyperlipidemia Mother    Hypertension Mother    Diabetes Mother    Lung cancer Mother 54   Diabetes Father     Heart disease Father    Hypertension Brother    Cervical cancer Maternal Grandmother        30   Heart attack Maternal Grandmother    Cancer Maternal Grandfather 77       stomach   Diabetes Maternal Grandfather    Hypertension Maternal Grandfather    Asthma Paternal Grandmother    Breast cancer Paternal Great-grandmother 52       breast    Social History   Socioeconomic History   Marital status: Legally Separated    Spouse name: Not on file   Number of children: 2   Years of education: 12   Highest education level: High school graduate  Occupational History   Occupation: Investment banker, corporate  Tobacco Use   Smoking status: Every Day    Current packs/day: 0.50    Types: Cigarettes    Passive exposure: Current   Smokeless tobacco: Never   Tobacco comments:    2024 - strying to quit  Vaping Use   Vaping status: Former  Substance and Sexual Activity  Alcohol use: Not Currently    Comment: Last ETOH use 06/29/2023   Drug use: Not Currently    Types: Marijuana, Other-see comments    Comment: Last marijuana use 5 years ago. 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   Social Determinants of Health   Financial Resource Strain: Low Risk  (07/27/2023)   Overall Financial Resource Strain (CARDIA)    Difficulty of Paying Living Expenses: Not very hard  Food Insecurity: Food Insecurity Present (07/27/2023)   Hunger Vital Sign    Worried About Running Out of Food in the Last Year: Sometimes true    Ran Out of Food in the Last Year: Sometimes true  Transportation Needs: No Transportation Needs (07/27/2023)   PRAPARE - Administrator, Civil Service (Medical): No    Lack of Transportation (Non-Medical): No  Physical Activity: Inactive (07/27/2023)   Exercise Vital Sign     Days of Exercise per Week: 0 days    Minutes of Exercise per Session: 0 min  Stress: No Stress Concern Present (07/27/2023)   [employer] of Occupational Health - Occupational Stress Questionnaire    Feeling of Stress : Not at all  Social Connections: Socially Integrated (07/27/2023)   Social Connection and Isolation Panel [NHANES]    Frequency of Communication with Friends and Family: More than three times a week    Frequency of Social Gatherings with Friends and Family: More than three times a week    Attends Religious Services: More than 4 times per year    Active Member of [employer] or Organizations: Yes    Attends Banker Meetings: More than 4 times per year    Marital Status: Living with partner  Intimate Partner Violence: Not At Risk (07/27/2023)   Humiliation, Afraid, Rape, and Kick questionnaire    Fear of Current or Ex-Partner: No    Emotionally Abused: No    Physically Abused: No    Sexually Abused: No    Current Outpatient Medications on File Prior to Visit  Medication Sig Dispense Refill   Prenatal Vit-Fe Fumarate-FA (MULTIVITAMIN-PRENATAL) 27-0.8 MG TABS tablet Take 1 tablet by mouth daily at 12 noon.     No current facility-administered medications on file prior to visit.   No Known Allergies    Review of Systems Constitutional: No recent fever/chills/sweats Respiratory: No recent cough/bronchitis Cardiovascular: No chest pain Gastrointestinal: No recent nausea/vomiting/diarrhea Genitourinary: No UTI symptoms Hematologic/lymphatic:No history of coagulopathy or recent blood thinner use    Objective:   Blood pressure 112/64, pulse 79, resp. rate 16, height 5\' 4"  (1.626 m), weight 119 lb 14.4 oz (54.4 kg), last menstrual period 06/05/2023. CONSTITUTIONAL: Well-developed, well-nourished female in no acute distress.  HENT:  Normocephalic, atraumatic, External right and left ear normal. Oropharynx is clear and moist EYES: Conjunctivae and EOM are  normal. Pupils are equal, round, and reactive to light. No scleral icterus.  NECK: Normal range of motion, supple, no masses SKIN: Skin is warm and dry. No rash noted. Not diaphoretic. No erythema. No pallor. NEUROLOGIC: Alert and oriented to person, place, and time. Normal reflexes, muscle tone coordination. No cranial nerve deficit noted. PSYCHIATRIC: Normal mood and affect. Normal behavior. Normal judgment and thought content. CARDIOVASCULAR: Normal  heart rate noted, regular rhythm RESPIRATORY: Effort and breath sounds normal, no problems with respiration noted ABDOMEN: Soft, nontender, nondistended.  Bedside ultrasound performed still noting uterus with empty gestational sac, no IUP visualized. PELVIC: Deferred MUSCULOSKELETAL: Normal range of motion. No edema and no tenderness. 2+ distal pulses.    Labs: Results for orders placed or performed in visit on 08/16/23 (from the past 336 hour(s))  Beta hCG quant (ref lab)   Collection Time: 08/16/23  9:55 AM  Result Value Ref Range   hCG Quant 16,109 mIU/mL  Results for orders placed or performed in visit on 08/14/23 (from the past 336 hour(s))  Beta hCG quant (ref lab)   Collection Time: 08/14/23 10:58 AM  Result Value Ref Range   hCG Quant 83,602 mIU/mL     Imaging Studies: US OB LESS THAN 14 WEEKS WITH OB TRANSVAGINAL  Result Date: 08/17/2023 ULTRASOUND REPORT  Location: [facility] at [facility] Date of Service: 08/14/2023  Indications:dating Findings: Mason Jim intrauterine pregnancy is visualized with a GS consistent with [redacted]w[redacted]d gestation, giving an (U/S) EDD of 03/26/2024. The (U/S) EDD is not consistent with the clinically established EDD of 03/11/2024.  FHR: not visualized CRL measurement: not embryo visualized Yolk sac is not visualized. Amnion: vizualized and appears abnormal  Right Ovary is normal in appearance. Left Ovary is normal appearance. Corpus luteal cyst:  is not visualized Survey of the adnexa demonstrates no  adnexal masses. There is no free peritoneal fluid in the cul de sac.  Impression: 1. [redacted]w[redacted]d nonviable Singleton Intrauterine pregnancy by U/S. 2. (U/S) EDD is not consistent with Clinically established EDD of 03/26/2024.  Recommendations: 1.Clinical correlation with the patient's History and Physical Exam.   Waldo Laine, RT I have reviewed this study and agree with documented findings. To correlate with labs and clinical assessment. Hildred Laser, MD [facility]   Assessment:    Blighted ovum History of multiple pregnancy losses   Plan:   - Patient opts for D&E for management of missed abortion (blighted ovum).  Risks of surgery including bleeding, infection, injury to surrounding organs, need for additional procedures, possibility of intrauterine scarring which may impair future fertility, risk of retained products which may require further management and other postoperative/anesthesia complications will be explained to patient. Surgery to be scheduled for 08/24/23.  - Pre-op testing ordered.  - Instructed on NPO status beginning after midnight on day prior to surgery.  - Discussed option of carrier screening and products of conception genetic testing due to history of multiple pregnancy losses.  Patient notes that she would like to have testing performed.  Horizon screen ordered today.  Will perform Anora testing on products of conception at time of D&C.    Hildred Laser, MD [facility]

## 2023-08-23 ENCOUNTER — Encounter
Admission: RE | Admit: 2023-08-23 | Discharge: 2023-08-23 | Disposition: A | Discharge status: Home / Self Care | Payer: Medicaid Other | Source: Ambulatory Visit | Attending: Obstetrics and Gynecology | Admitting: Obstetrics and Gynecology

## 2023-08-23 ENCOUNTER — Other Ambulatory Visit: Payer: Self-pay

## 2023-08-23 VITALS — Ht 64.0 in | Wt 119.0 lb

## 2023-08-23 DIAGNOSIS — O021 Missed abortion: Secondary | ICD-10-CM

## 2023-08-23 HISTORY — DX: Deficiency of other specified B group vitamins: E53.8

## 2023-08-23 LAB — CBC
Hematocrit: 39.4 % (ref 34.0–46.6)
Hemoglobin: 13.2 g/dL (ref 11.1–15.9)
MCH: 34 pg — ABNORMAL HIGH (ref 26.6–33.0)
MCHC: 33.5 g/dL (ref 31.5–35.7)
MCV: 102 fL — ABNORMAL HIGH (ref 79–97)
Platelets: 289 10*3/uL (ref 150–450)
RBC: 3.88 x10E6/uL (ref 3.77–5.28)
RDW: 11 % — ABNORMAL LOW (ref 11.7–15.4)
WBC: 7.8 10*3/uL (ref 3.4–10.8)

## 2023-08-23 NOTE — Patient Instructions (Signed)
Your procedure is scheduled on: Friday 08/24/23 arrival is at 11 am  To find out your arrival time, please call [phone] between 1PM - 3PM on:   N/A Report to the Registration Desk on the 1st floor of the [facility]. Free Valet parking is available.  If your arrival time is 6:00 am, do not arrive before that time as the [facility] entrance doors do not open until 6:00 am.  REMEMBER: Instructions that are not followed completely may result in serious medical risk, up to and including death; or upon the discretion of your surgeon and anesthesiologist your surgery may need to be rescheduled.  Do not eat food or ddrink any liquids after midnight the night before surgery.  No gum chewing or hard candies.  One week prior to surgery: Stop Anti-inflammatories (NSAIDS) such as Advil, Aleve, Ibuprofen, Motrin, Naproxen, Naprosyn and Aspirin based products such as Excedrin, Goody's Powder, BC Powder. You may however, continue to take Tylenol if needed for pain up until the day of surgery.  Stop ANY OVER THE COUNTER supplements until after surgery.  Continue taking all prescribed medications.  TAKE ONLY THESE MEDICATIONS THE MORNING OF SURGERY WITH A SIP OF WATER:  none  No Alcohol for 24 hours before or after surgery.  No Smoking including e-cigarettes for 24 hours before surgery.  No chewable tobacco products for at least 6 hours before surgery.  No nicotine patches on the day of surgery.  Do not use any "recreational" drugs for at least a week (preferably 2 weeks) before your surgery.  Please be advised that the combination of cocaine and anesthesia may have negative outcomes, up to and including death. If you test positive for cocaine, your surgery will be cancelled.  On the morning of surgery brush your teeth with toothpaste and water, you may rinse your mouth with mouthwash if you wish. Do not swallow any toothpaste or mouthwash.  Use CHG Soap or wipes as directed on  instruction sheet. Shower with your regular soap.  Do not wear lotions, powders, or perfumes.   Do not shave body hair from the neck down 48 hours before surgery.  Wear comfortable clothing (specific to your surgery type) to the hospital.  Do not wear jewelry, make-up, hairpins, clips or nail polish.  For welded (permanent) jewelry: bracelets, anklets, waist bands, etc.  Please have this removed prior to surgery.  If it is not removed, there is a chance that hospital personnel will need to cut it off on the day of surgery. Contact lenses, hearing aids and dentures may not be worn into surgery.  Do not bring valuables to the hospital. [facility] is not responsible for any missing/lost belongings or valuables.   Notify your doctor if there is any change in your medical condition (cold, fever, infection).  If you are being discharged the day of surgery, you will not be allowed to drive home. You will need a responsible individual to drive you home and stay with you for 24 hours after surgery.   If you are taking public transportation, you will need to have a responsible individual with you.  If you are being admitted to the hospital overnight, leave your suitcase in the car. After surgery it may be brought to your room.  In case of increased patient census, it may be necessary for you, the patient, to continue your postoperative care in the Same Day Surgery department.  After surgery, you can help prevent lung complications by doing breathing  exercises.  Take deep breaths and cough every 1-2 hours. Your doctor may order a device called an Incentive Spirometer to help you take deep breaths. When coughing or sneezing, hold a pillow firmly against your incision with both hands. This is called "splinting." Doing this helps protect your incision. It also decreases belly discomfort.  Surgery Visitation Policy:  Patients undergoing a surgery or procedure may have two family members or support  persons with them as long as the person is not COVID-19 positive or experiencing its symptoms.   Inpatient Visitation:    Visiting hours are 7 a.m. to 8 p.m. Up to four visitors are allowed at one time in a patient room. The visitors may rotate out with other people during the day. One designated support person (adult) may remain overnight.  Please call the Pre-admissions Testing Dept. at [phone] if you have any questions about these instructions.

## 2023-08-24 ENCOUNTER — Encounter: Payer: Self-pay | Admitting: Obstetrics and Gynecology

## 2023-08-24 ENCOUNTER — Encounter
Admission: RE | Disposition: A | Discharge status: Home / Self Care | Payer: Self-pay | Source: Home / Self Care | Attending: Obstetrics and Gynecology

## 2023-08-24 ENCOUNTER — Ambulatory Visit: Payer: Medicaid Other | Admitting: Anesthesiology

## 2023-08-24 ENCOUNTER — Other Ambulatory Visit: Payer: Self-pay

## 2023-08-24 ENCOUNTER — Ambulatory Visit: Payer: Medicaid Other | Admitting: Urgent Care

## 2023-08-24 ENCOUNTER — Ambulatory Visit
Admission: RE | Admit: 2023-08-24 | Discharge: 2023-08-24 | Disposition: A | Discharge status: Home / Self Care | Payer: Medicaid Other | Attending: Obstetrics and Gynecology | Admitting: Obstetrics and Gynecology

## 2023-08-24 DIAGNOSIS — N96 Recurrent pregnancy loss: Secondary | ICD-10-CM

## 2023-08-24 DIAGNOSIS — O020 Blighted ovum and nonhydatidiform mole: Secondary | ICD-10-CM | POA: Insufficient documentation

## 2023-08-24 DIAGNOSIS — Z3A01 Less than 8 weeks gestation of pregnancy: Secondary | ICD-10-CM | POA: Diagnosis not present

## 2023-08-24 DIAGNOSIS — F17210 Nicotine dependence, cigarettes, uncomplicated: Secondary | ICD-10-CM | POA: Diagnosis not present

## 2023-08-24 DIAGNOSIS — O021 Missed abortion: Secondary | ICD-10-CM | POA: Diagnosis not present

## 2023-08-24 HISTORY — PX: DILATION AND EVACUATION: SHX1459

## 2023-08-24 LAB — TYPE AND SCREEN
ABO/RH(D): O POS
Antibody Screen: NEGATIVE

## 2023-08-24 SURGERY — DILATION AND EVACUATION, UTERUS
Anesthesia: General | Site: Vagina

## 2023-08-24 MED ORDER — FENTANYL CITRATE (PF) 100 MCG/2ML IJ SOLN
INTRAMUSCULAR | Status: DC | PRN
  Administered 2023-08-24 (×2): 50 ug via INTRAVENOUS

## 2023-08-24 MED ORDER — LIDOCAINE-EPINEPHRINE 1 %-1:100000 IJ SOLN
INTRAMUSCULAR | Status: DC | PRN
Start: 2023-08-24 — End: 2023-08-24
  Administered 2023-08-24: 10 mL

## 2023-08-24 MED ORDER — CHLORHEXIDINE GLUCONATE 0.12 % MT SOLN
OROMUCOSAL | Status: AC
  Filled 2023-08-24: qty 15

## 2023-08-24 MED ORDER — LACTATED RINGERS IV SOLN: INTRAVENOUS | Status: DC

## 2023-08-24 MED ORDER — ORAL CARE MOUTH RINSE: 15.0000 mL | Freq: Once | OROMUCOSAL | Status: AC

## 2023-08-24 MED ORDER — MIDAZOLAM HCL 2 MG/2ML IJ SOLN
INTRAMUSCULAR | Status: AC
  Filled 2023-08-24: qty 2

## 2023-08-24 MED ORDER — FENTANYL CITRATE (PF) 100 MCG/2ML IJ SOLN
INTRAMUSCULAR | Status: AC
  Filled 2023-08-24: qty 2

## 2023-08-24 MED ORDER — ONDANSETRON HCL 4 MG/2ML IJ SOLN
INTRAMUSCULAR | Status: AC
  Filled 2023-08-24: qty 2

## 2023-08-24 MED ORDER — MIDAZOLAM HCL 2 MG/2ML IJ SOLN
INTRAMUSCULAR | Status: DC | PRN
  Administered 2023-08-24: 2 mg via INTRAVENOUS

## 2023-08-24 MED ORDER — 0.9 % SODIUM CHLORIDE (POUR BTL) OPTIME
TOPICAL | Status: DC | PRN
  Administered 2023-08-24: 500 mL

## 2023-08-24 MED ORDER — PROPOFOL 500 MG/50ML IV EMUL
INTRAVENOUS | Status: DC | PRN
  Administered 2023-08-24: 150 ug/kg/min via INTRAVENOUS

## 2023-08-24 MED ORDER — IBUPROFEN 600 MG PO TABS: 600.0000 mg | ORAL_TABLET | Freq: Four times a day (QID) | ORAL | 1 refills | Status: DC | PRN

## 2023-08-24 MED ORDER — FENTANYL CITRATE (PF) 100 MCG/2ML IJ SOLN: 25.0000 ug | INTRAMUSCULAR | Status: DC | PRN

## 2023-08-24 MED ORDER — LIDOCAINE HCL (PF) 2 % IJ SOLN
INTRAMUSCULAR | Status: DC | PRN
Start: 2023-08-24 — End: 2023-08-24
  Administered 2023-08-24: 50 mg via INTRADERMAL

## 2023-08-24 MED ORDER — LACTATED RINGERS IV SOLN: INTRAVENOUS | Status: AC

## 2023-08-24 MED ORDER — ACETAMINOPHEN 500 MG PO TABS
1000.0000 mg | ORAL_TABLET | ORAL | Status: AC
  Administered 2023-08-24: 1000 mg via ORAL

## 2023-08-24 MED ORDER — PROPOFOL 10 MG/ML IV BOLUS
INTRAVENOUS | Status: DC | PRN
  Administered 2023-08-24: 50 mg via INTRAVENOUS

## 2023-08-24 MED ORDER — CHLORHEXIDINE GLUCONATE 0.12 % MT SOLN
15.0000 mL | Freq: Once | OROMUCOSAL | Status: AC
  Administered 2023-08-24: 15 mL via OROMUCOSAL

## 2023-08-24 MED ORDER — ONDANSETRON HCL 4 MG/2ML IJ SOLN
INTRAMUSCULAR | Status: DC | PRN
  Administered 2023-08-24: 4 mg via INTRAVENOUS

## 2023-08-24 MED ORDER — PROPOFOL 1000 MG/100ML IV EMUL
INTRAVENOUS | Status: AC
  Filled 2023-08-24: qty 100

## 2023-08-24 MED ORDER — LIDOCAINE HCL (PF) 2 % IJ SOLN
INTRAMUSCULAR | Status: AC
  Filled 2023-08-24: qty 5

## 2023-08-24 MED ORDER — DEXMEDETOMIDINE HCL IN NACL 80 MCG/20ML IV SOLN
INTRAVENOUS | Status: DC | PRN
Start: 2023-08-24 — End: 2023-08-24
  Administered 2023-08-24: 12 ug via INTRAVENOUS

## 2023-08-24 MED ORDER — PROPOFOL 10 MG/ML IV BOLUS
INTRAVENOUS | Status: AC
  Filled 2023-08-24: qty 20

## 2023-08-24 MED ORDER — DOXYCYCLINE HYCLATE 100 MG IV SOLR
200.0000 mg | INTRAVENOUS | Status: AC
  Administered 2023-08-24: 200 mg via INTRAVENOUS
  Filled 2023-08-24: qty 200

## 2023-08-24 MED ORDER — LIDOCAINE-EPINEPHRINE 1 %-1:100000 IJ SOLN
INTRAMUSCULAR | Status: AC
  Filled 2023-08-24: qty 1

## 2023-08-24 MED ORDER — LACTATED RINGERS IV SOLN
INTRAVENOUS | Status: DC | PRN
Start: 2023-08-24 — End: 2023-08-24

## 2023-08-24 MED ORDER — ACETAMINOPHEN 500 MG PO TABS
ORAL_TABLET | ORAL | Status: AC
  Filled 2023-08-24: qty 2

## 2023-08-24 SURGICAL SUPPLY — 27 items
DRSG TELFA 3X8 NADH STRL (GAUZE/BANDAGES/DRESSINGS) ×1 IMPLANT
FILTER UTR ASPR SPEC (MISCELLANEOUS) ×1 IMPLANT
FLTR UTR ASPR SPEC (MISCELLANEOUS) ×1
GLOVE BIO SURGEON STRL SZ 6.5 (GLOVE) ×1 IMPLANT
GLOVE INDICATOR 7.0 STRL GRN (GLOVE) ×1 IMPLANT
GOWN STRL REUS W/ TWL LRG LVL3 (GOWN DISPOSABLE) ×1 IMPLANT
GOWN STRL REUS W/TWL LRG LVL3 (GOWN DISPOSABLE) ×1
KIT BERKELEY 1ST TRIMESTER 3/8 (MISCELLANEOUS) ×1 IMPLANT
KIT TURNOVER KIT A (KITS) ×1 IMPLANT
NDL HYPO 25X1 1.5 SAFETY (NEEDLE) ×1 IMPLANT
NDL SPNL 22GX5 LNG QUINC BK (NEEDLE) ×1 IMPLANT
NEEDLE HYPO 25X1 1.5 SAFETY (NEEDLE) ×1 IMPLANT
NEEDLE SPNL 22GX5 LNG QUINC BK (NEEDLE) ×1 IMPLANT
PACK DNC HYST (MISCELLANEOUS) ×1 IMPLANT
PAD OB MATERNITY 4.3X12.25 (PERSONAL CARE ITEMS) ×1 IMPLANT
PAD PREP OB/GYN DISP 24X41 (PERSONAL CARE ITEMS) ×1 IMPLANT
SCRUB CHG 4% DYNA-HEX 4OZ (MISCELLANEOUS) ×1 IMPLANT
SET BERKELEY SUCTION TUBING (SUCTIONS) ×1 IMPLANT
SOL PREP PVP 2OZ (MISCELLANEOUS) ×1
SOLUTION PREP PVP 2OZ (MISCELLANEOUS) ×1 IMPLANT
SYR 10ML LL (SYRINGE) ×1 IMPLANT
TRAP FLUID SMOKE EVACUATOR (MISCELLANEOUS) ×1 IMPLANT
VACURETTE 10 RIGID CVD (CANNULA) IMPLANT
VACURETTE 12 RIGID CVD (CANNULA) IMPLANT
VACURETTE 7MM F TIP STRL (CANNULA) IMPLANT
VACURETTE 8 RIGID CVD (CANNULA) IMPLANT
WATER STERILE IRR 500ML POUR (IV SOLUTION) ×1 IMPLANT

## 2023-08-24 NOTE — Op Note (Signed)
Betty Powell PROCEDURE DATE: 08/24/2023 1:26 PM  PREOPERATIVE DIAGNOSIS: Blighted Ovum POSTOPERATIVE DIAGNOSIS: Blighted Ovum PROCEDURE: Procedure(s): Suction D&C (N/A) SURGEON:  Dr. Hildred Laser ASSISTANT:  None ANESTHESIA: General  INDICATIONS: 32 y.o. [id number] with history ofMissed AB at [redacted] weeks gestation presents for surgical management.   Please see preoperative notes for further details.   FINDINGS:  Uterus wasMidplane and 7 week size. Tissue consistent with products of conception was removed and sent to Pathology.   I/O's: Total I/O In: 250 [IV Piggyback:250] Out: -  SPECIMENS: Products of conception  COMPLICATIONS: None immediate COUNTS:  YES, correct x 2  PROCEDURE IN DETAIL: The patient was brought to the operating room where she was placed into the supine position.  General LMA anesthesia was induced without incident. She was placed in the dorsal lithotomy position using candy cane stirrups, and was examined; A Betadine prep and drape was performed in sterile fashion.   A  Red Robinson catheter was used to drain the bladder of urine. A weighted speculum was placed into  the vagina and a single tooth tenaculum was applied to the anterior lip of the cervix. The cervix was gently dilated using Hank's Dilators to accommodate a 7 mm suction curette, that was gently advanced to the uterine fundus.  The suction device was then activated and the curette was slowly rotated to clear the uterine cavity of products of conception.  A sharp curettage with a serrated curette  was then performed to confirm complete emptying of the uterus. Minimal bleeding was encountered. The tenaculum was removed along with all instruments  from the  vagina.   The patient was awakened, mobilized and taken to the recovery room in satisfactory condition.  The procedure was well-tolerated.  The patient will be discharged to home as per PACU criteria.  Routine postoperative instructions were given along with a  prescription for analgesics.  She will follow up in the clinic in 1-2 weeks for postoperative evaluation.  Hildred Laser, MD [facility]

## 2023-08-24 NOTE — H&P (Signed)
GYNECOLOGY PREOPERATIVE HISTORY AND PHYSICAL   Subjective:  Betty Powell is a 32 y.o. [phone] here for surgical management of missed abortion at [redacted] weeks gestation (blighted ovum).  Patient has a history of pregnancy loss, however has also had 2 successful pregnancies as well.  She has previously been counseled on all management options for this miscarriage including medical versus surgical, and has decided on surgical management. No significant preoperative concerns.  Patient desires repeat ultrasound today, "just to be sure".   Proposed surgery: Suction dilation and curettage   Pertinent Gynecological History: Menses: History of irregular menses, and endometriosis. Bleeding: None Contraception: none Last pap: normal Date: 11/30/2020   Past Medical History:  Diagnosis Date   Anxiety    B12 deficiency    BV (bacterial vaginosis)    Endometriosis    Epilepsy (HCC)    History of spontaneous abortion    x3   Seizures (HCC)    no seizures since high school   Trichomoniasis     Past Surgical History:  Procedure Laterality Date   BREAST BIOPSY Right 02/14/2023   [place] Bx, Venus Clip, path pending   BREAST BIOPSY Right 02/14/2023   [place] RT BREAST BX W LOC DEV 1ST LESION IMG BX SPEC US GUIDE 02/14/2023 [facility]   elective abortion     2021   WISDOM TOOTH EXTRACTION      OB History  Gravida Para Term Preterm AB Living  7 2 2  0 4 2  SAB IAB Ectopic Multiple Live Births  3 0 0 0 2    # Outcome Date GA Lbr Len/2nd Weight Sex Type Anes PTL Lv  7 Current           6 AB 09/17/19 [redacted]w[redacted]d         5 Term 02/17/17 [redacted]w[redacted]d 03:50 / 00:06 3630 g M Vag-Spont None  LIV     Birth Comments: no anomalies observed at delivery  4 Term 01/08/12 [redacted]w[redacted]d  2920 g M Vag-Spont  N LIV  3 SAB 2012     SAB     2 SAB 2012     SAB     1 SAB 2011     SAB       Family History  Problem Relation Age of Onset   Hyperlipidemia Mother    Hypertension Mother    Diabetes Mother    Lung cancer  Mother 15   Diabetes Father    Heart disease Father    Hypertension Brother    Cervical cancer Maternal Grandmother        30   Heart attack Maternal Grandmother    Cancer Maternal Grandfather 48       stomach   Diabetes Maternal Grandfather    Hypertension Maternal Grandfather    Asthma Paternal Grandmother    Breast cancer Paternal Great-grandmother 51       breast    Social History   Socioeconomic History   Marital status: Legally Separated    Spouse name: Not on file   Number of children: 2   Years of education: 12   Highest education level: High school graduate  Occupational History   Occupation: Investment banker, corporate  Tobacco Use   Smoking status: Every Day    Current packs/day: 0.50    Types: Cigarettes    Passive exposure: Current   Smokeless tobacco: Never   Tobacco comments:    2024 - strying to quit  Vaping Use   Vaping status: Former  Substance and Sexual Activity   Alcohol use: Not Currently    Comment: Last ETOH use 06/29/2023   Drug use: Not Currently    Types: Marijuana, Other-see comments    Comment: Last marijuana use 5 years ago. 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   Social Determinants of Health   Financial Resource Strain: Low Risk  (07/27/2023)   Overall Financial Resource Strain (CARDIA)    Difficulty of Paying Living Expenses: Not very hard  Food Insecurity: Food Insecurity Present (07/27/2023)   Hunger Vital Sign    Worried About Running Out of Food in the Last Year: Sometimes true    Ran Out of Food in the Last Year: Sometimes true  Transportation Needs: No Transportation Needs (07/27/2023)   PRAPARE - Administrator, Civil Service (Medical): No    Lack of Transportation (Non-Medical): No  Physical Activity: Inactive  (07/27/2023)   Exercise Vital Sign    Days of Exercise per Week: 0 days    Minutes of Exercise per Session: 0 min  Stress: No Stress Concern Present (07/27/2023)   [employer] of Occupational Health - Occupational Stress Questionnaire    Feeling of Stress : Not at all  Social Connections: Socially Integrated (07/27/2023)   Social Connection and Isolation Panel [NHANES]    Frequency of Communication with Friends and Family: More than three times a week    Frequency of Social Gatherings with Friends and Family: More than three times a week    Attends Religious Services: More than 4 times per year    Active Member of [employer] or Organizations: Yes    Attends Banker Meetings: More than 4 times per year    Marital Status: Living with partner  Intimate Partner Violence: Not At Risk (07/27/2023)   Humiliation, Afraid, Rape, and Kick questionnaire    Fear of Current or Ex-Partner: No    Emotionally Abused: No    Physically Abused: No    Sexually Abused: No    No current facility-administered medications on file prior to encounter.   Current Outpatient Medications on File Prior to Encounter  Medication Sig Dispense Refill   Prenatal Vit-Fe Fumarate-FA (MULTIVITAMIN-PRENATAL) 27-0.8 MG TABS tablet Take 1 tablet by mouth daily at 12 noon. (Patient not taking: Reported on 08/24/2023)     No Known Allergies    Review of Systems Constitutional: No recent fever/chills/sweats Respiratory: No recent cough/bronchitis Cardiovascular: No chest pain Gastrointestinal: No recent nausea/vomiting/diarrhea Genitourinary: No UTI symptoms Hematologic/lymphatic:No history of coagulopathy or recent blood thinner use    Objective:   Blood pressure 104/69, pulse 70, temperature 97.8 F (36.6 C), temperature source Temporal, resp. rate 18, height 5\' 4"  (1.626 m), weight 54 kg, last menstrual period 06/05/2023, SpO2 100%. CONSTITUTIONAL: Well-developed, well-nourished female in no acute  distress.  HENT:  Normocephalic, atraumatic, External right and left ear normal. Oropharynx is clear and moist EYES: Conjunctivae and EOM are normal. Pupils are equal, round, and reactive to light. No scleral icterus.  NECK: Normal range of motion, supple, no masses SKIN: Skin is warm and dry. No rash noted. Not diaphoretic. No erythema. No pallor. NEUROLOGIC: Alert and oriented to person, place, and time. Normal reflexes, muscle tone coordination. No  cranial nerve deficit noted. PSYCHIATRIC: Normal mood and affect. Normal behavior. Normal judgment and thought content. CARDIOVASCULAR: Normal heart rate noted, regular rhythm RESPIRATORY: Effort and breath sounds normal, no problems with respiration noted ABDOMEN: Soft, nontender, nondistended.  Bedside ultrasound performed still noting uterus with empty gestational sac, no IUP visualized. PELVIC: Deferred MUSCULOSKELETAL: Normal range of motion. No edema and no tenderness. 2+ distal pulses.    Labs: Results for orders placed or performed during the hospital encounter of 08/24/23 (from the past 336 hour(s))  Type and screen [facility]   Collection Time: 08/24/23 11:17 AM  Result Value Ref Range   ABO/RH(D) PENDING    Antibody Screen PENDING    Sample Expiration      08/27/2023,2359 Performed at [facility], [address] [place], [place] [address]   Results for orders placed or performed in visit on 08/22/23 (from the past 336 hour(s))  CBC   Collection Time: 08/22/23 11:03 AM  Result Value Ref Range   WBC 7.8 3.4 - 10.8 x10E3/uL   RBC 3.88 3.77 - 5.28 x10E6/uL   Hemoglobin 13.2 11.1 - 15.9 g/dL   Hematocrit 81.1 91.4 - 46.6 %   MCV 102 (H) 79 - 97 fL   MCH 34.0 (H) 26.6 - 33.0 pg   MCHC 33.5 31.5 - 35.7 g/dL   RDW 78.2 (L) 95.6 - 21.3 %   Platelets 289 150 - 450 x10E3/uL  Results for orders placed or performed in visit on 08/16/23 (from the past 336 hour(s))  Beta hCG quant (ref lab)    Collection Time: 08/16/23  9:55 AM  Result Value Ref Range   hCG Quant 08,657 mIU/mL  Results for orders placed or performed in visit on 08/14/23 (from the past 336 hour(s))  Beta hCG quant (ref lab)   Collection Time: 08/14/23 10:58 AM  Result Value Ref Range   hCG Quant 83,602 mIU/mL     Imaging Studies: US OB LESS THAN 14 WEEKS WITH OB TRANSVAGINAL  Result Date: 08/17/2023 ULTRASOUND REPORT  Location: [facility] Date of Service: 08/14/2023  Indications:dating Findings: Mason Jim intrauterine pregnancy is visualized with a GS consistent with [redacted]w[redacted]d gestation, giving an (U/S) EDD of 03/26/2024. The (U/S) EDD is not consistent with the clinically established EDD of 03/11/2024.  FHR: not visualized CRL measurement: not embryo visualized Yolk sac is not visualized. Amnion: vizualized and appears abnormal  Right Ovary is normal in appearance. Left Ovary is normal appearance. Corpus luteal cyst:  is not visualized Survey of the adnexa demonstrates no adnexal masses. There is no free peritoneal fluid in the cul de sac.  Impression: 1. [redacted]w[redacted]d nonviable Singleton Intrauterine pregnancy by U/S. 2. (U/S) EDD is not consistent with Clinically established EDD of 03/26/2024.  Recommendations: 1.Clinical correlation with the patient's History and Physical Exam.   Waldo Laine, RT I have reviewed this study and agree with documented findings. To correlate with labs and clinical assessment. Hildred Laser, MD [facility]   Assessment:    Blighted ovum History of multiple pregnancy losses   Plan:   - Patient opts for D&E for management of missed abortion (blighted ovum).  Risks of surgery including bleeding, infection, injury to surrounding organs, need for additional procedures, possibility of intrauterine scarring which may impair future fertility, risk of retained products which may require further management and other postoperative/anesthesia complications will be explained to patient.  Surgery to be scheduled for 08/24/23.  - Pre-op testing ordered.  - Has been  NPO after midnight for surgery - Anora testing on products of conception at time of D&C.    Hildred Laser, MD [facility]

## 2023-08-24 NOTE — Anesthesia Preprocedure Evaluation (Signed)
Anesthesia Evaluation  Patient identified by MRN, date of birth, ID band Patient awake    Reviewed: Allergy & Precautions, H&P , NPO status , Patient's Chart, lab work & pertinent test results, reviewed documented beta blocker date and time   History of Anesthesia Complications Negative for: history of anesthetic complications  Airway Mallampati: I  TM Distance: >3 FB Neck ROM: full    Dental  (+) Dental Advidsory Given, Teeth Intact, Chipped   Pulmonary neg shortness of breath, Continuous Positive Airway Pressure Ventilation neg sleep apnea, neg COPD, neg recent URI, Current Smoker   Pulmonary exam normal breath sounds clear to auscultation       Cardiovascular Exercise Tolerance: Good negative cardio ROS Normal cardiovascular exam Rhythm:regular Rate:Normal     Neuro/Psych Seizures - (history of, but none since high school),  PSYCHIATRIC DISORDERS Anxiety        GI/Hepatic negative GI ROS, Neg liver ROS,,,  Endo/Other  negative endocrine ROS    Renal/GU negative Renal ROS  negative genitourinary   Musculoskeletal   Abdominal   Peds  Hematology negative hematology ROS (+)   Anesthesia Other Findings Past Medical History: No date: Anxiety No date: B12 deficiency No date: BV (bacterial vaginosis) No date: Endometriosis No date: Epilepsy (HCC) No date: History of spontaneous abortion     Comment:  x3 No date: Seizures (HCC)     Comment:  no seizures since high school No date: Trichomoniasis   Reproductive/Obstetrics (+) Pregnancy (11 weeks since last period, sac measuring 7 weeks.)                             Anesthesia Physical Anesthesia Plan  ASA: 2  Anesthesia Plan: General   Post-op Pain Management:    Induction: Intravenous  PONV Risk Score and Plan: 2 and Propofol infusion, TIVA and Treatment may vary due to age or medical condition  Airway Management Planned: Natural  Airway and Simple Face Mask  Additional Equipment:   Intra-op Plan:   Post-operative Plan:   Informed Consent: I have reviewed the patients History and Physical, chart, labs and discussed the procedure including the risks, benefits and alternatives for the proposed anesthesia with the patient or authorized representative who has indicated his/her understanding and acceptance.     Dental Advisory Given  Plan Discussed with: Anesthesiologist, CRNA and Surgeon  Anesthesia Plan Comments:         Anesthesia Quick Evaluation

## 2023-08-24 NOTE — Plan of Care (Signed)
CHL Tonsillectomy/Adenoidectomy, Postoperative PEDS care plan entered in error.

## 2023-08-24 NOTE — Transfer of Care (Signed)
Immediate Anesthesia Transfer of Care Note  Patient: KARALINE WIEBKE  Procedure(s) Performed: Suction D&C (Vagina )  Patient Location: PACU  Anesthesia Type:General  Level of Consciousness: awake, alert , and oriented  Airway & Oxygen Therapy: Patient Spontanous Breathing  Post-op Assessment: Report given to RN and Post -op Vital signs reviewed and stable  Post vital signs: Reviewed and stable  Last Vitals:  Vitals Value Taken Time  BP 95/57 08/24/23 1403  Temp    Pulse 73 08/24/23 1404  Resp 12 08/24/23 1404  SpO2 99 % 08/24/23 1404  Vitals shown include unfiled device data.  Last Pain:  Vitals:   08/24/23 1058  TempSrc: Temporal  PainSc: 0-No pain         Complications: No notable events documented.

## 2023-08-24 NOTE — Discharge Instructions (Signed)

## 2023-08-25 ENCOUNTER — Encounter: Payer: Self-pay | Admitting: Obstetrics and Gynecology

## 2023-08-26 NOTE — Plan of Care (Signed)
CHL Tonsillectomy/Adenoidectomy, Postoperative PEDS care plan entered in error.

## 2023-08-29 LAB — HORIZON 14 (PAN-ETHNIC STANDARD)
ALPHA-THALASSEMIA: NEGATIVE
BETA-HEMOGLOBINOPATHIES: NEGATIVE
CANAVAN DISEASE: NEGATIVE
CYSTIC FIBROSIS: POSITIVE — AB
DUCHENNE/BECKER MUSCULAR DYSTROPHY: NEGATIVE
FAMILIAL DYSAUTONOMIA: NEGATIVE
FRAGILE X SYNDROME: NEGATIVE
GALACTOSEMIA: NEGATIVE
GAUCHER DISEASE: NEGATIVE
MEDIUM CHAIN ACYL-COA DEHYDROGENASE DEFICIENCY: NEGATIVE
POLYCYSTIC KIDNEY DISEASE AUTOSOMAL RECESSIVE: NEGATIVE
REPORT SUMMARY: POSITIVE — AB
SMITH-LEMLI-OPITZ SYNDROME: NEGATIVE
SPINAL MUSCULAR ATROPHY: NEGATIVE
TAY-SACHS DISEASE: NEGATIVE

## 2023-08-30 LAB — SURGICAL PATHOLOGY

## 2023-08-31 ENCOUNTER — Ambulatory Visit (INDEPENDENT_AMBULATORY_CARE_PROVIDER_SITE_OTHER): Payer: Medicaid Other | Admitting: Obstetrics and Gynecology

## 2023-08-31 ENCOUNTER — Encounter: Payer: Self-pay | Admitting: Obstetrics and Gynecology

## 2023-08-31 ENCOUNTER — Telehealth: Payer: Self-pay

## 2023-08-31 VITALS — BP 91/67 | HR 85 | Resp 16 | Ht 64.0 in | Wt 120.1 lb

## 2023-08-31 DIAGNOSIS — Z9889 Other specified postprocedural states: Secondary | ICD-10-CM

## 2023-08-31 DIAGNOSIS — R897 Abnormal histological findings in specimens from other organs, systems and tissues: Secondary | ICD-10-CM

## 2023-08-31 DIAGNOSIS — O039 Complete or unspecified spontaneous abortion without complication: Secondary | ICD-10-CM

## 2023-08-31 DIAGNOSIS — Z4889 Encounter for other specified surgical aftercare: Secondary | ICD-10-CM

## 2023-08-31 DIAGNOSIS — Z141 Cystic fibrosis carrier: Secondary | ICD-10-CM

## 2023-08-31 HISTORY — DX: Cystic fibrosis carrier: Z14.1

## 2023-08-31 NOTE — Progress Notes (Signed)
OBSTETRICS/GYNECOLOGY POST-OPERATIVE CLINIC VISIT  Subjective:     Betty Powell is a 32 y.o. female who presents to the clinic 1 weeks status post  Suction D&C  for Blighted Ovum.  Patient also with a history of multiple pregnancy losses.  Eating a regular diet without difficulty. Bowel movements are normal. The patient is not having any pain.  Notes bleeding has almost essentially resolved.  Mood wise, patient notes that although this was an unfortunate situation she is attempting to move past it as best that she can.  She has experienced loss before.  Does note of an upcoming cruise that she will be going on tomorrow with her family that should help to take her mind off things and lift her spirits.  The following portions of the patient's history were reviewed and updated as appropriate: allergies, current medications, past family history, past medical history, past social history, past surgical history, and problem list.  Review of Systems Pertinent items are noted in HPI.   Objective:   BP 91/67   Pulse 85   Resp 16   Ht 5\' 4"  (1.626 m)   Wt 120 lb 1.6 oz (54.5 kg)   LMP 06/05/2023 (Exact Date)   BMI 20.62 kg/m  Body mass index is 20.62 kg/m.  General:  alert and no distress  Abdomen: soft, bowel sounds active, non-tender  Pelvis:  deferred    Pathology:   REPORT OF SURGICAL PATHOLOGY  Accession #: [id number] Patient Name: TAMERA, WERTH Visit # : [id number]  MRN: [id number] Physician: Hildred Laser DOB/Age March 06, 1991 (Age: 62) Gender: F Collected Date: 08/24/2023 Received Date: 08/24/2023  FINAL DIAGNOSIS       1. 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  Clinical      correlation and testing for cytogenetic  abnormalities including a partial mole      is recommended.  Dr. Valentino Saxon was updated via telephone and secure chat on 10/23      and 08/30/23.  Dr. Venetia Night reviewed the case and agrees with the above      diagnosis.    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 Atypical pathology  Plan:   1. Continue any current medications as needed, as instructed by provider. 2. Wound care discussed.  Advised on additional pelvic rest for the next 3 to 5 days. 3. Operative findings again reviewed. Pathology report discussed.  Advised that as atypical villi were noted, with slight concern of molar pregnancy after discussion with pathologist, will follow patient's hormonal trend down to negative levels to ensure resolution of pregnancy.  Also still pending Anora miscarriage testing results. 4.  Reviewed cystic fibrosis carrier status.  Also advised to get partner tested if you has never been tested.  Patient has 2 other children by a different FOB, however is currently incarcerated.  As both of her children are older and have not had any symptoms, likely may not require testing of that FOB.Marland Kitchen 5. Activity restrictions:  Pelvic rest for  3 to 5 days. 6. Anticipated return to work: 1-2 weeks. 7. Follow up: 3 weeks for repeat beta-hCG levels.    Hildred Laser, MD [facility]

## 2023-08-31 NOTE — Telephone Encounter (Signed)
Betty Powell called to see if this pt came here to our practice and who ordered her Anora testing.

## 2023-09-10 NOTE — Anesthesia Postprocedure Evaluation (Signed)
Anesthesia Post Note  Patient: Betty Powell  Procedure(s) Performed: Suction D&C (Vagina )  Patient location during evaluation: PACU Anesthesia Type: General Level of consciousness: awake and alert Pain management: pain level controlled Vital Signs Assessment: post-procedure vital signs reviewed and stable Respiratory status: spontaneous breathing, nonlabored ventilation, respiratory function stable and patient connected to nasal cannula oxygen Cardiovascular status: blood pressure returned to baseline and stable Postop Assessment: no apparent nausea or vomiting Anesthetic complications: no   No notable events documented.   Last Vitals:  Vitals:   08/24/23 1440 08/24/23 1446  BP: 110/67 102/62  Pulse: 62 73  Resp: 20 18  Temp: (!) 36.3 C (!) 36.2 C  SpO2: 100% 100%    Last Pain:  Vitals:   08/24/23 1446  TempSrc: Temporal  PainSc: 0-No pain                 Lenard Simmer

## 2023-09-21 ENCOUNTER — Other Ambulatory Visit: Payer: Medicaid Other

## 2023-09-21 DIAGNOSIS — O039 Complete or unspecified spontaneous abortion without complication: Secondary | ICD-10-CM

## 2023-09-22 LAB — BETA HCG QUANT (REF LAB): hCG Quant: 6 m[IU]/mL

## 2023-11-07 NOTE — L&D Delivery Note (Signed)
 Delivery Note   Betty Powell is a 33 y.o. [id number] at [redacted]w[redacted]d Estimated Date of Delivery: 08/08/24  PRE-OPERATIVE DIAGNOSIS:  1) [redacted]w[redacted]d pregnancy.    POST-OPERATIVE DIAGNOSIS:  1) [redacted]w[redacted]d pregnancy s/p Vaginal, Spontaneous   Delivery Type: Vaginal, Spontaneous   Delivery Anesthesia: Epidural  Labor Complications: None    ESTIMATED BLOOD LOSS: 650 ml    FINDINGS:   1) female infant, Apgar scores of 8 at 1 minute and 9 at 5 minutes and a birthweight of 3130 g.   SPECIMENS:   PLACENTA:   Appearance: Intact   Removal: Spontaneous     Disposition: Per protocol  CORD BLOOD: Collected DISPOSITION:  Infant left in stable condition in the delivery room, with L&D personnel and mother  NARRATIVE SUMMARY: Labor course:  [name] is a [id number] at [redacted]w[redacted]d who presented to Labor & Delivery for contractions. She had a NRNST and was recommended to stay for IOL. Her initial cervical exam was 1.5/60/-2. Labor proceeded with augmentation, and she was found to be completely dilated at 1340. With excellent maternal pushing effort, she easily birthed a viable female infant at 46. There was not a nuchal cord. The shoulders were birthed without difficulty. The infant was placed skin-to-skin with [place]. Pitocin  was infused for AMSTL.The cord was doubly clamped and cut by the father when pulsations ceased. The placenta delivered spontaneously and was noted to be intact with a 3VC. A perineal and vaginal examination was performed. Episiotomy/Lacerations: none . Dashonda tolerated this well. Mother and baby were left in stable condition.   Eleanor Canny, CNM 08/06/2024 2:08 PM

## 2023-11-27 ENCOUNTER — Ambulatory Visit (INDEPENDENT_AMBULATORY_CARE_PROVIDER_SITE_OTHER): Payer: Medicaid Other

## 2023-11-27 VITALS — BP 118/77 | HR 91 | Ht 64.0 in | Wt 119.5 lb

## 2023-11-27 DIAGNOSIS — Z3201 Encounter for pregnancy test, result positive: Secondary | ICD-10-CM

## 2023-11-27 DIAGNOSIS — N912 Amenorrhea, unspecified: Secondary | ICD-10-CM

## 2023-11-27 DIAGNOSIS — Z8759 Personal history of other complications of pregnancy, childbirth and the puerperium: Secondary | ICD-10-CM

## 2023-11-27 LAB — POCT URINE PREGNANCY: Preg Test, Ur: POSITIVE — AB

## 2023-11-27 NOTE — Patient Instructions (Signed)
First Trimester of Pregnancy  The first trimester of pregnancy starts on the first day of your last monthly period until the end of week 13. This is months 1 through 3 of pregnancy. A week after a sperm fertilizes an egg, the egg will implant into the wall of the uterus and begin to develop into a baby. Body changes during your first trimester Your body goes through many changes during pregnancy. The changes usually return to normal after your baby is born. Physical changes Your breasts may grow larger and may hurt. The area around your nipples may get darker. Your periods will stop. Your hair and nails may grow faster. You may pee more often. Health changes You may tire easily. Your gums may bleed and may be sensitive when you brush and floss. You may not feel hungry. You may have heartburn. You may throw up or feel like you may throw up. You may want to eat some foods, but not others. You may have headaches. You may have trouble pooping (constipation). Other changes Your emotions may change from day to day. You may have more dreams. Follow these instructions at home: Medicines Talk to your health care provider if you're taking medicines. Ask if the medicines are safe to take during pregnancy. Your provider may change the medicines that you take. Do not take any medicines unless told to by your provider. Take a prenatal vitamin that has at least 600 micrograms (mcg) of folic acid. Do not use herbal medicines, illegal substances, or medicines that are not approved by your provider. Eating and drinking While you're pregnant your body needs extra food for your growing baby. Talk with your provider about what to eat while pregnant. Activity Most women are able to exercise during pregnancy. Exercises may need to change as your pregnancy goes on. Talk to your provider about your activities and exercise routines. Relieving pain and discomfort Wear a good, supportive bra if your breasts  hurt. Rest with your legs raised if you have leg cramps or low back pain. Safety Wear your seatbelt at all times when you're in a car. Talk to your provider if someone hits you, hurts you, or yells at you. Talk with your provider if you're feeling sad or have thoughts of hurting yourself. Lifestyle Certain things can be harmful while you're pregnant. Follow these rules: Do not use hot tubs, steam rooms, or saunas. Do not douche. Do not use tampons or scented pads. Do not drink alcohol,smoke, vape, or use products with nicotine or tobacco in them. If you need help quitting, talk with your provider. Avoid cat litter boxes and soil used by cats. These things carry germs that can cause harm to your pregnancy and your baby. General instructions Keep all follow-up visits. It helps you and your unborn baby stay as healthy as possible. Write down your questions. Take them to your visits. Your provider will: Talk with you about your overall health. Give you advice or refer you to specialists who can help with different needs, including: Prenatal education classes. Mental health and counseling. Foods and healthy eating. Ask for help if you need help with food. Call your dentist and ask to be seen. Brush your teeth with a soft toothbrush. Floss gently. Where to find more information American Pregnancy Association: americanpregnancy.org [employer]: acog.org Office on [employer] Health: [employer] Contact a health care provider if: You feel dizzy, faint, or have a fever. You vomit or have watery poop (diarrhea) for 2  days or more. You have abnormal discharge or bleeding from your vagina. You have pain when you pee or your pee smells bad. You have cramps, pain, or pressure in your belly area. Get help right away if: You have trouble breathing or chest pain. You have any kind of injury, such as from a fall or a car crash. These symptoms may be an  emergency. Get help right away. Call 911. Do not wait to see if the symptoms will go away. Do not drive yourself to the hospital. This information is not intended to replace advice given to you by your health care provider. Make sure you discuss any questions you have with your health care provider. Document Revised: 07/26/2023 Document Reviewed: 02/23/2023 Elsevier Patient Education  2024 [employer] Inc.  Commonly Asked Questions During Pregnancy  Cats: A parasite can be excreted in cat feces.  To avoid exposure you need to have another person empty the little box.  If you must empty the litter box you will need to wear gloves.  Wash your hands after handling your cat.  This parasite can also be found in raw or undercooked meat so this should also be avoided.  Colds, Sore Throats, Flu: Please check your medication sheet to see what you can take for symptoms.  If your symptoms are unrelieved by these medications please call the office.  Dental Work: Most any dental work Agricultural consultant recommends is permitted.  X-rays should only be taken during the first trimester if absolutely necessary.  Your abdomen should be shielded with a lead apron during all x-rays.  Please notify your provider prior to receiving any x-rays.  Novocaine is fine; gas is not recommended.  If your dentist requires a note from [place] prior to dental work please call the office and we will provide one for you.  Exercise: Exercise is an important part of staying healthy during your pregnancy.  You may continue most exercises you were accustomed to prior to pregnancy.  Later in your pregnancy you will most likely notice you have difficulty with activities requiring balance like riding a bicycle.  It is important that you listen to your body and avoid activities that put you at a higher risk of falling.  Adequate rest and staying well hydrated are a must!  If you have questions about the safety of specific activities ask your provider.     Exposure to Children with illness: Try to avoid obvious exposure; report any symptoms to [place] when noted,  If you have chicken pos, red measles or mumps, you should be immune to these diseases.   Please do not take any vaccines while pregnant unless you have checked with your OB provider.  Fetal Movement: After 28 weeks we recommend you do "kick counts" twice daily.  Lie or sit down in a calm quiet environment and count your baby movements "kicks".  You should feel your baby at least 10 times per hour.  If you have not felt 10 kicks within the first hour get up, walk around and have something sweet to eat or drink then repeat for an additional hour.  If count remains less than 10 per hour notify your provider.  Fumigating: Follow your pest control agent's advice as to how long to stay out of your home.  Ventilate the area well before re-entering.  Hemorrhoids:   Most over-the-counter preparations can be used during pregnancy.  Check your medication to see what is safe to use.  It is important to  use a stool softener or fiber in your diet and to drink lots of liquids.  If hemorrhoids seem to be getting worse please call the office.   Hot Tubs:  Hot tubs Jacuzzis and saunas are not recommended while pregnant.  These increase your internal body temperature and should be avoided.  Intercourse:  Sexual intercourse is safe during pregnancy as long as you are comfortable, unless otherwise advised by your provider.  Spotting may occur after intercourse; report any bright red bleeding that is heavier than spotting.  Labor:  If you know that you are in labor, please go to the hospital.  If you are unsure, please call the office and let us help you decide what to do.  Lifting, straining, etc:  If your job requires heavy lifting or straining please check with your provider for any limitations.  Generally, you should not lift items heavier than that you can lift simply with your hands and arms (no back  muscles)  Painting:  Paint fumes do not harm your pregnancy, but may make you ill and should be avoided if possible.  Latex or water based paints have less odor than oils.  Use adequate ventilation while painting.  Permanents & Hair Color:  Chemicals in hair dyes are not recommended as they cause increase hair dryness which can increase hair loss during pregnancy.  " Highlighting" and permanents are allowed.  Dye may be absorbed differently and permanents may not hold as well during pregnancy.  Sunbathing:  Use a sunscreen, as skin burns easily during pregnancy.  Drink plenty of fluids; avoid over heating.  Tanning Beds:  Because their possible side effects are still unknown, tanning beds are not recommended.  Ultrasound Scans:  Routine ultrasounds are performed at approximately 20 weeks.  You will be able to see your baby's general anatomy an if you would like to know the gender this can usually be determined as well.  If it is questionable when you conceived you may also receive an ultrasound early in your pregnancy for dating purposes.  Otherwise ultrasound exams are not routinely performed unless there is a medical necessity.  Although you can request a scan we ask that you pay for it when conducted because insurance does not cover " patient request" scans.  Work: If your pregnancy proceeds without complications you may work until your due date, unless your physician or employer advises otherwise.  Round Ligament Pain/Pelvic Discomfort:  Sharp, shooting pains not associated with bleeding are fairly common, usually occurring in the second trimester of pregnancy.  They tend to be worse when standing up or when you remain standing for long periods of time.  These are the result of pressure of certain pelvic ligaments called "round ligaments".  Rest, Tylenol and heat seem to be the most effective relief.  As the womb and fetus grow, they rise out of the pelvis and the discomfort improves.  Please  notify the office if your pain seems different than that described.  It may represent a more serious condition.  Common Medications Safe in Pregnancy  Acne:      Constipation:  Benzoyl Peroxide     Colace  Clindamycin      Dulcolax Suppository  Topica Erythromycin     Fibercon  Salicylic Acid      Metamucil         Miralax AVOID:        Senakot   Accutane    Cough:  Retin-A  Cough Drops  Tetracycline      Phenergan w/ Codeine if Rx  Minocycline      Robitussin (Plain & DM)  Antibiotics:     Crabs/Lice:  Ceclor       RID  Cephalosporins    AVOID:  E-Mycins      Kwell  Keflex  Macrobid/Macrodantin   Diarrhea:  Penicillin      Kao-Pectate  Zithromax      Imodium AD         PUSH FLUIDS AVOID:       Cipro     Fever:  Tetracycline      Tylenol (Regular or Extra  Minocycline       Strength)  Levaquin      Extra Strength-Do not          Exceed 8 tabs/24 hrs Caffeine:        200mg /day (equiv. To 1 cup of coffee or  approx. 3 12 oz sodas)         Gas: Cold/Hayfever:       Gas-X  Benadryl      Mylicon  Claritin       Phazyme  **Claritin-D        Chlor-Trimeton    Headaches:  Dimetapp      ASA-Free Excedrin  Drixoral-Non-Drowsy     Cold Compress  Mucinex (Guaifenasin)     Tylenol (Regular or Extra  Sudafed/Sudafed-12 Hour     Strength)  **Sudafed PE Pseudoephedrine   Tylenol Cold & Sinus     Vicks Vapor Rub  Zyrtec  **AVOID if Problems With Blood Pressure         Heartburn: Avoid lying down for at least 1 hour after meals  Aciphex      Maalox     Rash:  Milk of Magnesia     Benadryl    Mylanta       1% Hydrocortisone Cream  Pepcid  Pepcid Complete   Sleep Aids:  Prevacid      Ambien   Prilosec       Benadryl  Rolaids       Chamomile Tea  Tums (Limit 4/day)     Unisom         Tylenol PM         Warm milk-add vanilla or  Hemorrhoids:       Sugar for taste  Anusol/Anusol H.C.  (RX: Analapram 2.5%)  Sugar Substitutes:  Hydrocortisone OTC     Ok in  moderation  Preparation H      Tucks        Vaseline lotion applied to tissue with wiping    Herpes:     Throat:  Acyclovir      Oragel  Famvir  Valtrex     Vaccines:         Flu Shot Leg Cramps:       *Gardasil  Benadryl      Hepatitis A         Hepatitis B Nasal Spray:       Pneumovax  Saline Nasal Spray     Polio Booster         Tetanus Nausea:       Tuberculosis test or PPD  Vitamin B6 25 mg TID   AVOID:    Dramamine      *Gardasil  Emetrol       Live Poliovirus  Ginger Root 250 mg QID    MMR (measles, mumps &  High Complex Carbs @ Bedtime    rebella)  Sea Bands-Accupressure    Varicella (Chickenpox)  Unisom 1/2 tab TID     *No known complications           If received before Pain:         Known pregnancy;   Darvocet       Resume series after  Lortab        Delivery  Percocet    Yeast:   Tramadol      Femstat  Tylenol 3      Gyne-lotrimin  Ultram       Monistat  Vicodin           MISC:         All Sunscreens           Hair Coloring/highlights          Insect Repellant's          (Including DEET)         Mystic Tans

## 2023-11-27 NOTE — Progress Notes (Signed)
    NURSE VISIT NOTE  Subjective:    Patient ID: Betty Powell, female    DOB: Mar 18, 1991, 33 y.o.   MRN: [id number]  HPI  Patient is a 33 y.o. [id number] female who presents for evaluation of amenorrhea. She believes she could be pregnant. Pregnancy is desired. Sexual Activity: single partner, contraception: none. Current symptoms also include: frequent urination and positive home pregnancy test. Last period was abnormal, patient states that she bled for 3 days and heavy and cramping. .    Objective:    BP 118/77   Pulse 91   Ht 5\' 4"  (1.626 m)   Wt 119 lb 8 oz (54.2 kg)   LMP 10/15/2023 (Exact Date)   Breastfeeding No   BMI 20.51 kg/m   Lab Review  Results for orders placed or performed in visit on 11/27/23  POCT urine pregnancy  Result Value Ref Range   Preg Test, Ur Positive (A) Negative    Assessment:   1. Absence of menstruation   2. History of miscarriage     Plan:   Pregnancy Test: Positive  Estimated Date of Delivery: 07/21/24 BP Cuff Measurement taken. Cuff Size Adult Small Encouraged well-balanced diet, plenty of rest when needed, pre-natal vitamins daily and walking for exercise.  Discussed self-help for nausea, avoiding OTC medications until consulting provider or pharmacist, other than Tylenol as needed, minimal caffeine (1-2 cups daily) and avoiding alcohol.   She will schedule her nurse visit @ 7-[redacted] wks pregnant, u/s for dating @10  wk, and NOB visit at [redacted] wk pregnant.    Feel free to call with any questions.  Beta Quant Ordered per  Julieanne Manson, MD.    Fonda Kinder, CMA

## 2023-11-28 LAB — HUMAN CHORIONIC GONADOTROPIN(HCG),B-SUBUNIT,QUANTITATIVE): HCG, Beta Chain, Quant, S: 75 m[IU]/mL

## 2023-11-29 ENCOUNTER — Other Ambulatory Visit: Payer: Medicaid Other

## 2023-11-29 DIAGNOSIS — Z8759 Personal history of other complications of pregnancy, childbirth and the puerperium: Secondary | ICD-10-CM

## 2023-11-30 LAB — HUMAN CHORIONIC GONADOTROPIN(HCG),B-SUBUNIT,QUANTITATIVE): HCG, Beta Chain, Quant, S: 251 m[IU]/mL

## 2023-12-10 ENCOUNTER — Telehealth (INDEPENDENT_AMBULATORY_CARE_PROVIDER_SITE_OTHER): Payer: Medicaid Other

## 2023-12-10 VITALS — Ht 64.0 in | Wt 120.0 lb

## 2023-12-10 DIAGNOSIS — O3680X0 Pregnancy with inconclusive fetal viability, not applicable or unspecified: Secondary | ICD-10-CM

## 2023-12-10 DIAGNOSIS — Z8759 Personal history of other complications of pregnancy, childbirth and the puerperium: Secondary | ICD-10-CM

## 2023-12-10 DIAGNOSIS — O0990 Supervision of high risk pregnancy, unspecified, unspecified trimester: Secondary | ICD-10-CM | POA: Insufficient documentation

## 2023-12-10 DIAGNOSIS — Z3490 Encounter for supervision of normal pregnancy, unspecified, unspecified trimester: Secondary | ICD-10-CM | POA: Insufficient documentation

## 2023-12-10 DIAGNOSIS — O0993 Supervision of high risk pregnancy, unspecified, third trimester: Secondary | ICD-10-CM | POA: Insufficient documentation

## 2023-12-10 DIAGNOSIS — Z3480 Encounter for supervision of other normal pregnancy, unspecified trimester: Secondary | ICD-10-CM

## 2023-12-10 DIAGNOSIS — O99330 Smoking (tobacco) complicating pregnancy, unspecified trimester: Secondary | ICD-10-CM

## 2023-12-10 DIAGNOSIS — Z141 Cystic fibrosis carrier: Secondary | ICD-10-CM

## 2023-12-10 DIAGNOSIS — Z3689 Encounter for other specified antenatal screening: Secondary | ICD-10-CM

## 2023-12-10 DIAGNOSIS — N809 Endometriosis, unspecified: Secondary | ICD-10-CM

## 2023-12-10 DIAGNOSIS — N96 Recurrent pregnancy loss: Secondary | ICD-10-CM

## 2023-12-10 NOTE — Patient Instructions (Signed)
Prenatal Classes offered through [facility] [email or web address]  Doula Website [email or web address]   First Trimester of Pregnancy  The first trimester of pregnancy starts on the first day of your last monthly period until the end of week 13. This is months 1 through 3 of pregnancy. A week after a sperm fertilizes an egg, the egg will implant into the wall of the uterus and begin to develop into a baby. Body changes during your first trimester Your body goes through many changes during pregnancy. The changes usually return to normal after your baby is born. Physical changes Your breasts may grow larger and may hurt. The area around your nipples may get darker. Your periods will stop. Your hair and nails may grow faster. You may pee more often. Health changes You may tire easily. Your gums may bleed and may be sensitive when you brush and floss. You may not feel hungry. You may have heartburn. You may throw up or feel like you may throw up. You may want to eat some foods, but not others. You may have headaches. You may have trouble pooping (constipation). Other changes Your emotions may change from day to day. You may have more dreams. Follow these instructions at home: Medicines Talk to your health care provider if you're taking medicines. Ask if the medicines are safe to take during pregnancy. Your provider may change the medicines that you take. Do not take any medicines unless told to by your provider. Take a prenatal vitamin that has at least 600 micrograms (mcg) of folic acid. Do not use herbal medicines, illegal substances, or medicines that are not approved by your provider. Eating and drinking While you're pregnant your body needs extra food for your growing baby. Talk with your provider about what to eat while pregnant. Activity Most women are able  to exercise during pregnancy. Exercises may need to change as your pregnancy goes on. Talk to your provider about your activities and exercise routines. Relieving pain and discomfort Wear a good, supportive bra if your breasts hurt. Rest with your legs raised if you have leg cramps or low back pain. Safety Wear your seatbelt at all times when you're in a car. Talk to your provider if someone hits you, hurts you, or yells at you. Talk with your provider if you're feeling sad or have thoughts of hurting yourself. Lifestyle Certain things can be harmful while you're pregnant. Follow these rules: Do not use hot tubs, steam rooms, or saunas. Do not douche. Do not use tampons or scented pads. Do not drink alcohol,smoke, vape, or use products with nicotine or tobacco in them. If you need help quitting, talk with your provider. Avoid cat litter boxes and soil used by cats. These things carry germs that can cause harm to your pregnancy and your baby. General instructions Keep all follow-up visits. It helps you and your unborn baby stay as healthy as possible. Write down your questions. Take them to your visits. Your provider will: Talk with you about your overall health. Give you advice or refer you to specialists who can help with different needs, including: Prenatal education classes. Mental health and counseling. Foods and healthy eating. Ask for help if you need help with food. Call your dentist and ask to be seen. Brush your teeth with a soft toothbrush. Floss gently. Where to find more information American Pregnancy Association: americanpregnancy.org [employer]: acog.org Office on [employer] Health: TravelLesson.ca Contact a health care provider if: You feel dizzy, faint, or  have a fever. You vomit or have watery poop (diarrhea) for 2 days or more. You have abnormal discharge or bleeding from your vagina. You have pain when you pee or your pee smells  bad. You have cramps, pain, or pressure in your belly area. Get help right away if: You have trouble breathing or chest pain. You have any kind of injury, such as from a fall or a car crash. These symptoms may be an emergency. Get help right away. Call 911. Do not wait to see if the symptoms will go away. Do not drive yourself to the hospital. This information is not intended to replace advice given to you by your health care provider. Make sure you discuss any questions you have with your health care provider. Document Revised: 07/26/2023 Document Reviewed: 02/23/2023 Elsevier Patient Education  2024 Elsevier Inc.Commonly Asked Questions During Pregnancy  Cats: A parasite can be excreted in cat feces.  To avoid exposure you need to have another person empty the little box.  If you must empty the litter box you will need to wear gloves.  Wash your hands after handling your cat.  This parasite can also be found in raw or undercooked meat so this should also be avoided.  Colds, Sore Throats, Flu: Please check your medication sheet to see what you can take for symptoms.  If your symptoms are unrelieved by these medications please call the office.  Dental Work: Most any dental work Agricultural consultant recommends is permitted.  X-rays should only be taken during the first trimester if absolutely necessary.  Your abdomen should be shielded with a lead apron during all x-rays.  Please notify your provider prior to receiving any x-rays.  Novocaine is fine; gas is not recommended.  If your dentist requires a note from [place] prior to dental work please call the office and we will provide one for you.  Exercise: Exercise is an important part of staying healthy during your pregnancy.  You may continue most exercises you were accustomed to prior to pregnancy.  Later in your pregnancy you will most likely notice you have difficulty with activities requiring balance like riding a bicycle.  It is important that you listen  to your body and avoid activities that put you at a higher risk of falling.  Adequate rest and staying well hydrated are a must!  If you have questions about the safety of specific activities ask your provider.    Exposure to Children with illness: Try to avoid obvious exposure; report any symptoms to [place] when noted,  If you have chicken pos, red measles or mumps, you should be immune to these diseases.   Please do not take any vaccines while pregnant unless you have checked with your OB provider.  Fetal Movement: After 28 weeks we recommend you do "kick counts" twice daily.  Lie or sit down in a calm quiet environment and count your baby movements "kicks".  You should feel your baby at least 10 times per hour.  If you have not felt 10 kicks within the first hour get up, walk around and have something sweet to eat or drink then repeat for an additional hour.  If count remains less than 10 per hour notify your provider.  Fumigating: Follow your pest control agent's advice as to how long to stay out of your home.  Ventilate the area well before re-entering.  Hemorrhoids:   Most over-the-counter preparations can be used during pregnancy.  Check your medication to see  what is safe to use.  It is important to use a stool softener or fiber in your diet and to drink lots of liquids.  If hemorrhoids seem to be getting worse please call the office.   Hot Tubs:  Hot tubs Jacuzzis and saunas are not recommended while pregnant.  These increase your internal body temperature and should be avoided.  Intercourse:  Sexual intercourse is safe during pregnancy as long as you are comfortable, unless otherwise advised by your provider.  Spotting may occur after intercourse; report any bright red bleeding that is heavier than spotting.  Labor:  If you know that you are in labor, please go to the hospital.  If you are unsure, please call the office and let us help you decide what to do.  Lifting, straining, etc:  If your  job requires heavy lifting or straining please check with your provider for any limitations.  Generally, you should not lift items heavier than that you can lift simply with your hands and arms (no back muscles)  Painting:  Paint fumes do not harm your pregnancy, but may make you ill and should be avoided if possible.  Latex or water based paints have less odor than oils.  Use adequate ventilation while painting.  Permanents & Hair Color:  Chemicals in hair dyes are not recommended as they cause increase hair dryness which can increase hair loss during pregnancy.  " Highlighting" and permanents are allowed.  Dye may be absorbed differently and permanents may not hold as well during pregnancy.  Sunbathing:  Use a sunscreen, as skin burns easily during pregnancy.  Drink plenty of fluids; avoid over heating.  Tanning Beds:  Because their possible side effects are still unknown, tanning beds are not recommended.  Ultrasound Scans:  Routine ultrasounds are performed at approximately 20 weeks.  You will be able to see your baby's general anatomy an if you would like to know the gender this can usually be determined as well.  If it is questionable when you conceived you may also receive an ultrasound early in your pregnancy for dating purposes.  Otherwise ultrasound exams are not routinely performed unless there is a medical necessity.  Although you can request a scan we ask that you pay for it when conducted because insurance does not cover " patient request" scans.  Work: If your pregnancy proceeds without complications you may work until your due date, unless your physician or employer advises otherwise.  Round Ligament Pain/Pelvic Discomfort:  Sharp, shooting pains not associated with bleeding are fairly common, usually occurring in the second trimester of pregnancy.  They tend to be worse when standing up or when you remain standing for long periods of time.  These are the result of pressure of certain  pelvic ligaments called "round ligaments".  Rest, Tylenol and heat seem to be the most effective relief.  As the womb and fetus grow, they rise out of the pelvis and the discomfort improves.  Please notify the office if your pain seems different than that described.  It may represent a more serious condition.  Common Medications Safe in Pregnancy  Acne:      Constipation:  Benzoyl Peroxide     Colace  Clindamycin      Dulcolax Suppository  Topica Erythromycin     Fibercon  Salicylic Acid      Metamucil         Miralax AVOID:        Senakot   Accutane  Cough:  Retin-A       Cough Drops  Tetracycline      Phenergan w/ Codeine if Rx  Minocycline      Robitussin (Plain & DM)  Antibiotics:     Crabs/Lice:  Ceclor       RID  Cephalosporins    AVOID:  E-Mycins      Kwell  Keflex  Macrobid/Macrodantin   Diarrhea:  Penicillin      Kao-Pectate  Zithromax      Imodium AD         PUSH FLUIDS AVOID:       Cipro     Fever:  Tetracycline      Tylenol (Regular or Extra  Minocycline       Strength)  Levaquin      Extra Strength-Do not          Exceed 8 tabs/24 hrs Caffeine:        200mg /day (equiv. To 1 cup of coffee or  approx. 3 12 oz sodas)         Gas: Cold/Hayfever:       Gas-X  Benadryl      Mylicon  Claritin       Phazyme  **Claritin-D        Chlor-Trimeton    Headaches:  Dimetapp      ASA-Free Excedrin  Drixoral-Non-Drowsy     Cold Compress  Mucinex (Guaifenasin)     Tylenol (Regular or Extra  Sudafed/Sudafed-12 Hour     Strength)  **Sudafed PE Pseudoephedrine   Tylenol Cold & Sinus     Vicks Vapor Rub  Zyrtec  **AVOID if Problems With Blood Pressure         Heartburn: Avoid lying down for at least 1 hour after meals  Aciphex      Maalox     Rash:  Milk of Magnesia     Benadryl    Mylanta       1% Hydrocortisone Cream  Pepcid  Pepcid Complete   Sleep Aids:  Prevacid      Ambien   Prilosec       Benadryl  Rolaids       Chamomile Tea  Tums (Limit  4/day)     Unisom         Tylenol PM         Warm milk-add vanilla or  Hemorrhoids:       Sugar for taste  Anusol/Anusol H.C.  (RX: Analapram 2.5%)  Sugar Substitutes:  Hydrocortisone OTC     Ok in moderation  Preparation H      Tucks        Vaseline lotion applied to tissue with wiping    Herpes:     Throat:  Acyclovir      Oragel  Famvir  Valtrex     Vaccines:         Flu Shot Leg Cramps:       *Gardasil  Benadryl      Hepatitis A         Hepatitis B Nasal Spray:       Pneumovax  Saline Nasal Spray     Polio Booster         Tetanus Nausea:       Tuberculosis test or PPD  Vitamin B6 25 mg TID   AVOID:    Dramamine      *Gardasil  Emetrol       Live Poliovirus  Ginger Root 250 mg  QID    MMR (measles, mumps &  High Complex Carbs @ Bedtime    rebella)  Sea Bands-Accupressure    Varicella (Chickenpox)  Unisom 1/2 tab TID     *No known complications           If received before Pain:         Known pregnancy;   Darvocet       Resume series after  Lortab        Delivery  Percocet    Yeast:   Tramadol      Femstat  Tylenol 3      Gyne-lotrimin  Ultram       Monistat  Vicodin           MISC:         All Sunscreens           Hair Coloring/highlights          Insect Repellant's          (Including DEET)         Mystic Tans

## 2023-12-10 NOTE — Progress Notes (Signed)
New OB Intake  I connected with  Betty Powell on 12/10/23 at  9:15 AM EST by MyChart Video Visit and verified that I am speaking with the correct person using two identifiers. Nurse is located at [facility] and pt is located at home.  I discussed the limitations, risks, security and privacy concerns of performing an evaluation and management service by telephone and the availability of in person appointments. I also discussed with the patient that there may be a patient responsible charge related to this service. The patient expressed understanding and agreed to proceed.  I explained I am completing New OB Intake today. We discussed her EDD of 07/21/24 that is based on LMP of 10/15/23. Pt is G8/P2052. I reviewed her allergies, medications, Medical/Surgical/OB history, and appropriate screenings. There are cats in the home in the home  yes If yes Indoor, Outdoor Based on history, this is a/an uncomplicated pregnancy  . 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  Dating [place] Explained first scheduled [place] will be around 9-10 weeks. Dating [place] scheduled for 2/19 at 4 pm. Pt notified to arrive at 3:45.  Labs Discussed genetic screening with patient. Patient would like genetic testing to be drawn at new OB visit. Discussed possible labs to be drawn at new OB appointment.  COVID Vaccine Patient has not had COVID vaccine.   Social Determinants of Health Food Insecurity: denies food insecurity WIC Referral: Patient is interested in referral to [facility].  Transportation: Patient denies transportation needs. Childcare: Discussed no  children allowed at ultrasound appointments.   First visit review I reviewed new OB appt with pt. I explained she will have ob bloodwork and pap smear/pelvic exam if indicated. Explained pt will be seen by Dr. Lonny Prude at first visit; encounter routed to appropriate provider.   Deno Etienne Martin Smeal, CMA 12/10/2023  9:50 AM

## 2023-12-17 ENCOUNTER — Encounter: Payer: Self-pay | Admitting: Obstetrics

## 2023-12-26 ENCOUNTER — Ambulatory Visit: Payer: Medicaid Other

## 2023-12-26 DIAGNOSIS — Z8759 Personal history of other complications of pregnancy, childbirth and the puerperium: Secondary | ICD-10-CM

## 2023-12-26 DIAGNOSIS — Z3A01 Less than 8 weeks gestation of pregnancy: Secondary | ICD-10-CM

## 2023-12-26 DIAGNOSIS — Z3481 Encounter for supervision of other normal pregnancy, first trimester: Secondary | ICD-10-CM

## 2023-12-26 DIAGNOSIS — O3680X0 Pregnancy with inconclusive fetal viability, not applicable or unspecified: Secondary | ICD-10-CM

## 2023-12-26 DIAGNOSIS — Z3480 Encounter for supervision of other normal pregnancy, unspecified trimester: Secondary | ICD-10-CM

## 2023-12-31 ENCOUNTER — Encounter: Payer: Self-pay | Admitting: Obstetrics

## 2024-01-07 ENCOUNTER — Encounter: Payer: Medicaid Other | Admitting: Obstetrics

## 2024-01-21 ENCOUNTER — Ambulatory Visit (INDEPENDENT_AMBULATORY_CARE_PROVIDER_SITE_OTHER): Payer: Medicaid Other | Admitting: Obstetrics

## 2024-01-21 ENCOUNTER — Other Ambulatory Visit (HOSPITAL_COMMUNITY)
Admission: RE | Admit: 2024-01-21 | Discharge: 2024-01-21 | Disposition: A | Discharge status: Home / Self Care | Source: Ambulatory Visit | Attending: Obstetrics | Admitting: Obstetrics

## 2024-01-21 ENCOUNTER — Encounter: Payer: Self-pay | Admitting: Obstetrics

## 2024-01-21 VITALS — BP 115/68 | HR 109 | Wt 119.0 lb

## 2024-01-21 DIAGNOSIS — Z131 Encounter for screening for diabetes mellitus: Secondary | ICD-10-CM

## 2024-01-21 DIAGNOSIS — N96 Recurrent pregnancy loss: Secondary | ICD-10-CM

## 2024-01-21 DIAGNOSIS — O0990 Supervision of high risk pregnancy, unspecified, unspecified trimester: Secondary | ICD-10-CM

## 2024-01-21 DIAGNOSIS — Z141 Cystic fibrosis carrier: Secondary | ICD-10-CM

## 2024-01-21 DIAGNOSIS — Z720 Tobacco use: Secondary | ICD-10-CM

## 2024-01-21 DIAGNOSIS — Z8742 Personal history of other diseases of the female genital tract: Secondary | ICD-10-CM | POA: Diagnosis present

## 2024-01-21 DIAGNOSIS — Z3A11 11 weeks gestation of pregnancy: Secondary | ICD-10-CM | POA: Insufficient documentation

## 2024-01-21 DIAGNOSIS — D649 Anemia, unspecified: Secondary | ICD-10-CM | POA: Insufficient documentation

## 2024-01-21 DIAGNOSIS — Z113 Encounter for screening for infections with a predominantly sexual mode of transmission: Secondary | ICD-10-CM | POA: Diagnosis present

## 2024-01-21 DIAGNOSIS — Z1379 Encounter for other screening for genetic and chromosomal anomalies: Secondary | ICD-10-CM | POA: Insufficient documentation

## 2024-01-21 DIAGNOSIS — O0991 Supervision of high risk pregnancy, unspecified, first trimester: Secondary | ICD-10-CM

## 2024-01-21 MED ORDER — NICOTINE 14 MG/24HR TD PT24: 14.0000 mg | MEDICATED_PATCH | Freq: Every day | TRANSDERMAL | 0 refills | Status: DC

## 2024-01-21 NOTE — Progress Notes (Signed)
 NEW OB HISTORY AND PHYSICAL  SUBJECTIVE:       Betty Powell is a 33 y.o. [phone] female, Patient's last menstrual period was 10/15/2023 (exact date)., Estimated Date of Delivery: 08/08/24, [redacted]w[redacted]d, presents today for establishment of Prenatal Care. She reports nausea, fatigue, and headaches. She has history of recurrent SABs, with the most recent in October 2024.   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 Has been off Suboxone since 2018.   Gynecologic History Patient's last menstrual period was 10/15/2023 (exact date). Abnormal Contraception: none Last Pap: 11/30/2020. Results were: NILM, negative HPV  Obstetric History OB History  Gravida Para Term Preterm AB Living  8 2 2  0 5 2  SAB IAB Ectopic Multiple Live Births  4 0 0 0 2    # Outcome Date GA Lbr Len/2nd Weight Sex Type Anes PTL Lv  8 Current           7 SAB 08/2023 [redacted]w[redacted]d    SAB     6 AB 09/17/19 [redacted]w[redacted]d         5 Term 02/17/17 [redacted]w[redacted]d 03:50 / 00:06 8 lb (3.63 kg) M Vag-Spont None  LIV     Birth Comments: no anomalies observed at delivery  4 Term 01/08/12 [redacted]w[redacted]d  6 lb 7 oz (2.92 kg) M Vag-Spont  N LIV  3 SAB 2012     SAB     2 SAB 2012     SAB     1 SAB 2011     SAB       Past Medical History:  Diagnosis Date   Anxiety    B12 deficiency    BV (bacterial vaginosis)    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   Endometriosis    Epilepsy (HCC)    History of spontaneous abortion    x3   Seizures (HCC)    no seizures since high school   Trichomoniasis     Past Surgical History:  Procedure Laterality Date   BREAST BIOPSY Right 02/14/2023   [place] Bx, Venus Clip, path pending   BREAST BIOPSY Right 02/14/2023   [place] RT BREAST BX W LOC DEV 1ST LESION IMG BX SPEC US  GUIDE 02/14/2023 [facility]   DILATION AND EVACUATION N/A 08/24/2023   Procedure: Suction D&C;  Surgeon: Hildred Laser, MD;  Location: [facility];  Service: Gynecology;  Laterality: N/A;   elective abortion     2021   WISDOM TOOTH EXTRACTION      Current Outpatient Medications on File Prior to Visit  Medication Sig Dispense Refill   Prenatal Vit-Fe Fumarate-FA (MULTIVITAMIN-PRENATAL) 27-0.8 MG TABS tablet Take 1 tablet by mouth daily at 12 noon.     No current facility-administered medications on file prior to visit.    No Known Allergies  Social History   Socioeconomic History   Marital status: Legally Separated    Spouse name: Not on file   Number of children: 2   Years of education: 12   Highest education level: High school graduate  Occupational History   Occupation: Investment banker, corporate  Tobacco Use   Smoking status: Every Day    Current packs/day: 0.50    Types: Cigarettes    Passive exposure: Current   Smokeless tobacco: Never   Tobacco comments:  2024 - strying to quit    12/10/23- 5 per day with plans to quit  Vaping Use   Vaping status: Former  Substance and Sexual Activity   Alcohol use: Not Currently    Comment: Last ETOH use 06/29/2023   Drug use: Not Currently    Types: Marijuana, Other-see comments    Comment: Last marijuana use 5 years ago. 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   Social Drivers of Health   Financial Resource Strain: Low Risk  (07/27/2023)   Overall Financial Resource Strain (CARDIA)    Difficulty of Paying Living Expenses: Not very hard  Food Insecurity: Food Insecurity Present (07/27/2023)   Hunger Vital Sign    Worried About Running Out of Food in the Last Year: Sometimes true    Ran Out of Food in the Last Year: Sometimes true  Transportation Needs: No Transportation  Needs (07/27/2023)   PRAPARE - Administrator, Civil Service (Medical): No    Lack of Transportation (Non-Medical): No  Physical Activity: Inactive (07/27/2023)   Exercise Vital Sign    Days of Exercise per Week: 0 days    Minutes of Exercise per Session: 0 min  Stress: No Stress Concern Present (07/27/2023)   [employer] of Occupational Health - Occupational Stress Questionnaire    Feeling of Stress : Not at all  Social Connections: Socially Integrated (07/27/2023)   Social Connection and Isolation Panel [NHANES]    Frequency of Communication with Friends and Family: More than three times a week    Frequency of Social Gatherings with Friends and Family: More than three times a week    Attends Religious Services: More than 4 times per year    Active Member of [employer] or Organizations: Yes    Attends Banker Meetings: More than 4 times per year    Marital Status: Living with partner  Intimate Partner Violence: Not At Risk (07/27/2023)   Humiliation, Afraid, Rape, and Kick questionnaire    Fear of Current or Ex-Partner: No    Emotionally Abused: No    Physically Abused: No    Sexually Abused: No    Family History  Problem Relation Age of Onset   Hyperlipidemia Mother    Hypertension Mother    Diabetes Mother    Lung cancer Mother 50   Diabetes Father    Heart disease Father    Hypertension Brother    Cervical cancer Maternal Grandmother        30   Heart attack Maternal Grandmother    Cancer Maternal Grandfather 55       stomach   Diabetes Maternal Grandfather    Hypertension Maternal Grandfather    Asthma Paternal Grandmother    Breast cancer Paternal Great-grandmother 30       breast    The following portions of the patient's history were reviewed and updated as appropriate: allergies, current medications, past OB history, past medical history, past surgical history, past family history, past social history, and problem list.  Constitutional:  Denied constitutional symptoms, night sweats, recent illness, fatigue, fever, insomnia and weight loss.  Eyes: Denied eye symptoms, eye pain, photophobia, vision change and visual disturbance.  Ears/Nose/Throat/Neck: Denied ear, nose, throat or neck symptoms, hearing loss, nasal discharge, sinus congestion and sore  throat.  Cardiovascular: Denied cardiovascular symptoms, arrhythmia, chest pain/pressure, edema, exercise intolerance, orthopnea and palpitations.  Respiratory: Denied pulmonary symptoms, asthma, pleuritic pain, productive sputum, cough, dyspnea and wheezing.  Gastrointestinal: Denied, gastro-esophageal reflux, melena, nausea and vomiting.  Genitourinary: Denied genitourinary symptoms including symptomatic vaginal discharge, pelvic relaxation issues, and urinary complaints.  Musculoskeletal: Denied musculoskeletal symptoms, stiffness, swelling, muscle weakness and myalgia.  Dermatologic: Denied dermatology symptoms, rash and scar.  Neurologic: Denied neurology symptoms, dizziness, headache, neck pain and syncope.  Psychiatric: Denied psychiatric symptoms, anxiety and depression.  Endocrine: Denied endocrine symptoms including hot flashes and night sweats.    Indications for ASA therapy (per uptodate) One of the following: Previous pregnancy with preeclampsia, especially early onset and with an adverse outcome No Multifetal gestation No Chronic hypertension No Type 1 or 2 diabetes mellitus No Chronic kidney disease No Autoimmune disease (antiphospholipid syndrome, systemic lupus erythematosus) No  Two or more of the following: Nulliparity No Obesity (body mass index >30 kg/m2) No Family history of preeclampsia in mother or sister No Age >=35 years No Sociodemographic characteristics (African American race, low socioeconomic level) No Personal risk factors (eg, previous pregnancy with low birth weight or small for gestational age infant, previous adverse pregnancy outcome [eg,  stillbirth], interval >10 years between pregnancies) No   OBJECTIVE: Initial Physical Exam (New OB)  GENERAL APPEARANCE: alert, well appearing HEAD: normocephalic, atraumatic MOUTH: mucous membranes moist, pharynx normal without lesions THYROID: no thyromegaly or masses present BREASTS: patient declined exam LUNGS: clear to auscultation, no wheezes, rales or rhonchi, symmetric air entry HEART: regular rate and rhythm, no murmurs ABDOMEN: soft, nontender, nondistended, no abnormal masses, no epigastric pain, FHT present, and fundus soft, non-tender, 1 FB above SP EXTREMITIES: no redness or tenderness in the calves or thighs SKIN: normal coloration and turgor, no rashes LYMPH NODES: no adenopathy palpable NEUROLOGIC: alert, oriented, normal speech, no focal findings or movement disorder noted  PELVIC EXAM declined  ASSESSMENT: Normal pregnancy H/o recurrent SAB  PLAN: Routine prenatal care. We discussed an overview of prenatal care and when to call. Reviewed diet, exercise, and weight gain recommendations in pregnancy. Discussed benefits of breastfeeding and lactation resources at [facility]. Encouraged her Rose her smoking cessation efforts and prescribed nicotine patches. I answered all questions. Repeat US ordered d/t h/o recurrent SAB. Labs and genetic screening today.  See orders  Guadlupe Spanish, CNM

## 2024-01-22 LAB — CERVICOVAGINAL ANCILLARY ONLY
Bacterial Vaginitis (gardnerella): NEGATIVE
Candida Glabrata: NEGATIVE
Candida Vaginitis: NEGATIVE
Chlamydia: NEGATIVE
Comment: NEGATIVE
Comment: NEGATIVE
Comment: NEGATIVE
Comment: NEGATIVE
Comment: NEGATIVE
Comment: NORMAL
Neisseria Gonorrhea: NEGATIVE
Trichomonas: NEGATIVE

## 2024-01-22 LAB — URINALYSIS, ROUTINE W REFLEX MICROSCOPIC
Bilirubin, UA: NEGATIVE
Glucose, UA: NEGATIVE
Leukocytes,UA: NEGATIVE
Nitrite, UA: NEGATIVE
Protein,UA: NEGATIVE
RBC, UA: NEGATIVE
Specific Gravity, UA: 1.022 (ref 1.005–1.030)
Urobilinogen, Ur: 0.2 mg/dL (ref 0.2–1.0)
pH, UA: 6.5 (ref 5.0–7.5)

## 2024-01-23 LAB — CBC/D/PLT+RPR+RH+ABO+RUBIGG...
Basophils Absolute: 0.1 10*3/uL (ref 0.0–0.2)
Basos: 1 %
EOS (ABSOLUTE): 0.1 10*3/uL (ref 0.0–0.4)
Eos: 1 %
HCV Ab: NONREACTIVE
HIV Screen 4th Generation wRfx: NONREACTIVE
Hematocrit: 37 % (ref 34.0–46.6)
Hemoglobin: 12.5 g/dL (ref 11.1–15.9)
Hepatitis B Surface Ag: NEGATIVE
Immature Grans (Abs): 0 10*3/uL (ref 0.0–0.1)
Immature Granulocytes: 0 %
Lymphocytes Absolute: 1.5 10*3/uL (ref 0.7–3.1)
Lymphs: 17 %
MCH: 35.1 pg — ABNORMAL HIGH (ref 26.6–33.0)
MCHC: 33.8 g/dL (ref 31.5–35.7)
MCV: 104 fL — ABNORMAL HIGH (ref 79–97)
Monocytes Absolute: 0.4 10*3/uL (ref 0.1–0.9)
Monocytes: 4 %
Neutrophils Absolute: 6.7 10*3/uL (ref 1.4–7.0)
Neutrophils: 77 %
Platelets: 280 10*3/uL (ref 150–450)
RBC: 3.56 x10E6/uL — ABNORMAL LOW (ref 3.77–5.28)
RDW: 11.8 % (ref 11.7–15.4)
RPR Ser Ql: NONREACTIVE
Rh Factor: POSITIVE
Rubella Antibodies, IGG: 2.02 {index} (ref >0.99)
Varicella zoster IgG: REACTIVE
WBC: 8.8 10*3/uL (ref 3.4–10.8)

## 2024-01-23 LAB — HCV INTERPRETATION

## 2024-01-23 LAB — AB SCR+ANTIBODY ID

## 2024-01-24 LAB — URINE CULTURE, OB REFLEX

## 2024-01-24 LAB — CULTURE, OB URINE

## 2024-01-26 LAB — MATERNIT 21 PLUS CORE, BLOOD
Fetal Fraction: 25
Result (T21): NEGATIVE
Trisomy 13 (Patau syndrome): NEGATIVE
Trisomy 18 (Edwards syndrome): NEGATIVE
Trisomy 21 (Down syndrome): NEGATIVE

## 2024-01-28 ENCOUNTER — Encounter: Payer: Self-pay | Admitting: Obstetrics

## 2024-02-18 ENCOUNTER — Ambulatory Visit (INDEPENDENT_AMBULATORY_CARE_PROVIDER_SITE_OTHER): Admitting: Certified Nurse Midwife

## 2024-02-18 VITALS — BP 101/60 | HR 84 | Wt 142.1 lb

## 2024-02-18 DIAGNOSIS — Z363 Encounter for antenatal screening for malformations: Secondary | ICD-10-CM

## 2024-02-18 DIAGNOSIS — Z141 Cystic fibrosis carrier: Secondary | ICD-10-CM

## 2024-02-18 DIAGNOSIS — Z1379 Encounter for other screening for genetic and chromosomal anomalies: Secondary | ICD-10-CM | POA: Diagnosis not present

## 2024-02-18 DIAGNOSIS — Z3A15 15 weeks gestation of pregnancy: Secondary | ICD-10-CM | POA: Diagnosis not present

## 2024-02-18 DIAGNOSIS — O0990 Supervision of high risk pregnancy, unspecified, unspecified trimester: Secondary | ICD-10-CM | POA: Diagnosis not present

## 2024-02-18 NOTE — Patient Instructions (Signed)
 Second Trimester of Pregnancy  The second trimester of pregnancy is from week 14 through week 27. This is months 4 through 6 of pregnancy. During the second trimester: Morning sickness is less or has stopped. You may have more energy. You may feel hungry more often. At this time, your unborn baby is growing very fast. At the end of the sixth month, the unborn baby may be up to 12 inches long and weigh about 1 pounds. You will likely start to feel the baby move between 16 and 20 weeks of pregnancy. Body changes during your second trimester Your body continues to change during this time. The changes usually go away after your baby is born. Physical changes You will gain more weight. Your belly will get bigger. You may begin to get stretch marks on your hips, belly, and breasts. Your breasts will keep growing and may hurt. You may get dark spots or blotches on your face. A dark line from your belly button to the pubic area may appear. This line is called linea nigra. Your hair may grow faster and get thicker. Health changes You may have headaches. You may have heartburn. You may pee more often. You may have swollen, bulging veins (varicose veins). You may have trouble pooping (constipation), or swollen veins in the butt that can itch or get painful (hemorrhoids). You may have back pain. This is caused by: Weight gain. Pregnancy hormones that are relaxing the joints in your pelvis. Follow these instructions at home: Medicines Talk to your health care provider if you're taking medicines. Ask if the medicines are safe to take during pregnancy. Your provider may change the medicines that you take. Do not take any medicines unless told to by your provider. Take a prenatal vitamin that has at least 600 micrograms (mcg) of folic acid. Do not use herbal medicines, illegal drugs, or medicines that are not approved by your provider. Eating and drinking While you're pregnant your body needs  extra food for your growing baby. Talk with your provider about what to eat while pregnant. Activity Most women are able to exercise during pregnancy. Exercises may need to change as your pregnancy goes on. Talk to your provider about your activities and exercise routines. Relieving pain and discomfort Wear a good, supportive bra if your breasts hurt. Rest with your legs raised if you have leg cramps or low back pain. Take warm sitz baths to soothe pain from hemorrhoids. Use hemorrhoid cream if your provider says it's okay. Do not douche. Do not use tampons or scented pads. Do not use hot tubs, steam rooms, or saunas. Safety Wear your seatbelt at all times when you're in a car. Talk to your provider if someone hits you, hurts you, or yells at you. Talk with your provider if you're feeling sad or have thoughts of hurting yourself. Lifestyle Certain things can be harmful while you're pregnant. It's best to avoid the following: Do not drink alcohol,smoke, vape, or use products with nicotine or tobacco in them. If you need help quitting, talk with your provider. Avoid cat litter boxes and soil used by cats. These things carry germs that can cause harm to your pregnancy and your baby. General instructions Keep all follow-up visits. It helps you and your unborn baby stay as healthy as possible. Write down your questions. Take them to your prenatal visits. Your provider will: Talk with you about your overall health. Give you advice or refer you to specialists who can help with different needs,  including: Prenatal education classes. Mental health and counseling. Foods and healthy eating. Ask for help if you need help with food. Where to find more information American Pregnancy Association: americanpregnancy.org [employer] of Obstetricians and Gynecologists: acog.org Office on [employer] Health: TravelLesson.ca Contact a health care provider if: You have a headache that does not go away  when you take medicine. You have any of these problems: You can't eat or drink. You throw up or feel like you may throw up. You have watery poop (diarrhea) for 2 days or more. You have pain when you pee or your pee smells bad. You have been sick for 2 days or more and are not getting better. Contact your provider right away if: You have any of these coming from your vagina: Abnormal discharge. Bad-smelling fluid. Bleeding. Your baby is moving less than usual. You have contractions, belly cramping, or have pain in your pelvis or lower back. You have symptoms of high blood pressure or preeclampsia. These include: A severe, throbbing headache that does not go away. Sudden or extreme swelling of your face, hands, legs, or feet. Vision problems: You see spots. You have blurry vision. Your eyes are sensitive to light. If you can't reach the provider, go to an urgent care or emergency room. Get help right away if: You faint, become confused, or can't think clearly. You have chest pain or trouble breathing. You have any kind of injury, such as from a fall or a car crash. These symptoms may be an emergency. Call 911 right away. Do not wait to see if the symptoms will go away. Do not drive yourself to the hospital. This information is not intended to replace advice given to you by your health care provider. Make sure you discuss any questions you have with your health care provider. Document Revised: 07/26/2023 Document Reviewed: 02/23/2023 Elsevier Patient Education  2024 [employer].

## 2024-02-18 NOTE — Progress Notes (Signed)
    Return Prenatal Note   Subjective   33 y.o. [id number] at [redacted]w[redacted]d presents for this follow-up prenatal visit.  Patient reports swelling & knee pain. Just returned from [facility]. Ate at buffet twice this weekend, drank more soda than usual then had 4h drive. Swelling is improved today. Reviewed AFP, desires & was drawn today. Patient reports: Movement: Absent Contractions: Not present  Objective   Flow sheet Vitals: Pulse Rate: 84 BP: 101/60 Fetal Heart Rate (bpm): 160 Total weight gain: 27 lb 1.6 oz (12.3 kg)  General Appearance  No acute distress, well appearing, and well nourished Pulmonary   Normal work of breathing Neurologic   Alert and oriented to person, place, and time Psychiatric   Mood and affect within normal limits  Assessment/Plan   Plan  33 y.o. [id number] at [redacted]w[redacted]d presents for follow-up OB visit. Reviewed prenatal record including previous visit note.  Supervision of high risk pregnancy, antepartum Red flag symptoms & how to contact provider reviewed. AFP today. Anatomy ultrasound ordered. Encouraged monitoring of diet, avoid sugary foods/beverages, increase fresh fruits & vegetables.   Cystic fibrosis carrier Serenah reports her partner had testing with his PCP that showed he is a CF carrier as well. Genetic counseling referral offered & accepted, order placed.      Orders Placed This Encounter  Procedures   US  OB Comp + 14 Wk    Standing Status:   Future    Expected Date:   03/19/2024    Expiration Date:   05/19/2024    Reason for Exam (SYMPTOM  OR DIAGNOSIS REQUIRED):   anatomy ultrasound    Preferred Imaging Location?:   [facility]   AFP, Serum, Open Spina Bifida    Is patient insulin dependent?:   No    Weight (lbs):   142    Gestational Age (GA), weeks:   15    Date on which patient was at this GA:   02/15/2024    GA Calculation Method:   LMP    GA Calculation Method:   Ultrasound    Number of fetuses:   1   AMB [facility]FM GENETICS  REFERRAL    Referral Priority:   Routine    Referral Type:   Consultation    Referral Reason:   Specialty Services Required    Number of Visits Requested:   1   Return in 4 weeks (on 03/17/2024) for ROB.   Future Appointments  Date Time Provider Department Center  03/17/2024 10:15 AM [facility] US  1 [facility] None  03/17/2024 11:15 AM Phylliss Brenner, CNM [facility] None    For next visit:  continue with routine prenatal care     Forestine Igo, CNM  02/18/2511:19 PM

## 2024-02-18 NOTE — Assessment & Plan Note (Signed)
 Betty Powell reports her partner had testing with his PCP that showed he is a CF carrier as well. Genetic counseling referral offered & accepted, order placed.

## 2024-02-18 NOTE — Assessment & Plan Note (Addendum)
 Red flag symptoms & how to contact provider reviewed. AFP today. Anatomy ultrasound ordered. Encouraged monitoring of diet, avoid sugary foods/beverages, increase fresh fruits & vegetables.

## 2024-02-20 ENCOUNTER — Encounter: Payer: Self-pay | Admitting: Certified Nurse Midwife

## 2024-02-20 LAB — AFP, SERUM, OPEN SPINA BIFIDA
AFP MoM: 0.76
AFP Value: 24.1 ng/mL
Gest. Age on Collection Date: 15.4 wk
Maternal Age At EDD: 33.5 a
OSBR Risk 1 IN: 10000
Test Results:: NEGATIVE
Weight: 142 [lb_av]

## 2024-03-05 ENCOUNTER — Other Ambulatory Visit: Payer: Self-pay

## 2024-03-05 ENCOUNTER — Ambulatory Visit: Payer: Self-pay | Admitting: Maternal & Fetal Medicine

## 2024-03-05 ENCOUNTER — Ambulatory Visit: Attending: Maternal & Fetal Medicine

## 2024-03-05 VITALS — BP 127/82 | HR 92

## 2024-03-05 DIAGNOSIS — Z141 Cystic fibrosis carrier: Secondary | ICD-10-CM | POA: Diagnosis not present

## 2024-03-05 DIAGNOSIS — Z3A17 17 weeks gestation of pregnancy: Secondary | ICD-10-CM | POA: Diagnosis not present

## 2024-03-05 DIAGNOSIS — O0990 Supervision of high risk pregnancy, unspecified, unspecified trimester: Secondary | ICD-10-CM

## 2024-03-05 NOTE — Progress Notes (Signed)
 Referring provider: Binnie Buffalo, [facility] Length of consultation: 40 min face to face  Ms. Serfass was referred to [facility] for genetic counseling to review the results of recent carrier screening as well as testing options. She was present at this visit with her partner, Michael Kernodle.  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 CF is a condition characterized by the buildup of thick, sticky mucus that can damage the body's organs. Mucus lubricates and protects the linings of the airways, digestive system, reproductive system, and other organs and tissues. Individuals with CF have abnormally sticky mucus that cannot easily be cleared from the airways and digestive system, leading to progressive damage to the respiratory system and chronic digestive system problems. The most common features of CF include respiratory difficulties, bacterial infections in the lungs, the formation of scar tissue (fibrosis) and cysts in the lungs, pancreatic insufficiency, CF-related diabetes mellitus, diarrhea, malnutrition, poor growth, and weight loss. Most men with CF have congenital bilateral absence of the vas deferens (CBAVD) which causes female infertility. With therapies, such as daily respiratory therapies and medications to aid digestion, the median lifespan for people with CF is now in their 40's. Treatment may involve lung transplantation and CFTR protein modulators in some cases. CF is variably expressed, meaning features of the condition and their severity vary among affected individuals. Expression and severity of CF depends upon the specific mutations present in an affected individual.   CF is caused by mutations in the CFTR gene. This gene provides instructions for a channel that transports chloride ions into and out of cells. The flow of chloride ions helps control the movement of water in the body's  tissues, which is necessary for the production of thin, freely flowing mucus. Pathogenic variants in the CFTR gene disrupt the function of the chloride channels, preventing them from regulating the flow of chloride ions and water across cell membranes. Ms. 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 This variant is known to be associated with classic CF features.  CF is inherited as an autosomal recessive condition. This means that the current fetus is only at risk for CF if Ms. Maertens's partner is also a carrier for the condition. Based on the carrier frequency for CF in the Caucasian population, Ms. Skorupski's partner has a 1 in 25 chance of being a carrier for CF. Thus, the couple currently has a 1 in 100 (1%) chance of having a child with CF. If Ms. Ezekiel's partner is also identified to be a carrier, the risk for CF in the pregnancy would be 1 in 4 (25%).   If both members of a couple are known to be carriers, then diagnostic testing is available during pregnancy to determine if the fetus is affected.  This can be performed via [facility] at 10-[redacted] weeks gestation or amniocentesis after [redacted] weeks gestation.  We discussed the risks, benefits and limitations of these testing options. If diagnostic testing during pregnancy is not desired, CF testing can be ordered on cord blood at the time of delivery and is included in newborn screening in [place].  The newborn screening testing utilizes trypsinogen levels rather than genetic testing to detect affected individuals whereas genetic testing can determine the specific variants, if any, that a child may have inherited.  Additional screening: Ms. Lackman carrier screening was negative for the 13 other  conditions screened. Thus, her risk to be a carrier for these additional conditions (listed separately in the laboratory report) has been reduced but not eliminated. This also significantly reduces her  risk of having a child affected by one of these conditions.   Routine screening for chromosome conditions was also discussed.  This was previously ordered by her OB and the results of the MaterniT21 testing was low risk for Trisomy 13, 18, 21.  Maternal serum AFP testing was also ordered by her OB and showed a 1 in 10,000 chance for an open neural tube defect in this pregnancy.  Pregnancy history and Family history: We obtained a detailed family history and pregnancy history. This is the eighth pregnancy for Petrice.  She reported very early miscarriages in her first three pregnancies.  For each of those, she had a positive home pregnancy test and began a heavy period prior to seeing a doctor.  She then had two sons, now ages 26 and 7 years.  Both boys are in good health with normal growth and development.  The younger one is currently getting some help in school with learning and speech.  That pregnancy was complicated by Suboxone  therapy throughout the pregnancy, and Electa Grieve is said to have done very well in the neonatal period.  Lastacia is no longer on Suboxone  therapy.  She then had an elective pregnancy termination for personal reasons.  Most recently, Kensley and Bambi Lever had a pregnancy loss in October 2024.  Notes from the [facility] indicate that it was a blighted ovum.  Anora chromosome microarray analysis showed normal female results without maternal cell contamination.  Therefore, it does not appear that there was a chromosome condition in that pregnancy which may have contributed to the loss. The current pregnancy has been uncomplicated and Trissa reports no exposure to alcohol, medications or recreational drugs.  She is working to cut back on cigarettes, down to 1/2 pack per day or less (from 1/5 ppd prior to pregnancy).  In the family history, Emberlynn and Athena Bland reported several family members with mental health diagnoses and substance use concerns. We reviewed that while we do not know the  specific genetic factors that may contribute to these conditions, there are likely strong inherited factors in many families.  Other factors contributing to these conditions are environmental or situational factors which may be difficult to quantify.  Beatrize also reported two maternal first cousins with possible cardiac conditions.  They are adults who are currently being evaluated for heart problems.  We encouraged her to reach out if she has questions once they get more information.  Athena Bland stated that his mother also had recurrent pregnancy losses, perhaps 5 and all very early in pregnancy. No additional information about this history is known.     We reviewed that there may be many reasons for pregnancy loss including endocrine factors (i.e. uncontrolled diabetes mellitus, luteal phase deficiency, or polycystic ovarian syndrome), environmental agents, immunologic causes (i.e. antiphospholipid syndrome), maternal factors (i.e. uterine anatomic malformations, cervical abnormalities), as well as chromosomal and single-gene disorders. The overall rate of pregnancy loss is approximately 20%, with the majority occurring in the first trimester. Chromosomal causes account for 50-70% of first trimester losses but are less commonly cited as a cause for late gestational pregnancy loss. Most chromosome abnormalities are a result of a new change in the sperm or egg that conceived the fetus; however, in 3-5% of all couples with RPL one member of the couple is found to have a balanced  chromosome rearrangement, called a translocation. A translocation occurs when there is an exchange in chromosome material, such as when a segment of one chromosome breaks off and reattaches to a different chromosome. When the translocation is balanced, there is no extra or missing genetic material; however, an individual who carries a balanced translocation has an increased risk to pass unbalanced chromosomes to their offspring. In couples in  which one partner is a carrier of a balanced translocation, this can result in an increased risk for infertility, RPL, and offspring with abnormal chromosomes. The fact that their most recent loss appeared to have normal chromosomes makes this less of a concern.  Testing for antiphospholipid syndrome could also be considered, though the timing of the losses is less concerning for this as well.  Jiya declined labs to evaluate these possible causes.  The remainder of the family history was unremarkable for birth differences, intellectual delays or known genetic conditions.  Plan of care: Horizon 14 carrier screening was drawn today on Athena Bland at his request.  Jacqueline Matsu dob 09/06/1990).  He would like us  to reach out to him with those results when they become available. Then we can speak with Kattrina about any future testing options. The couple was clear they were unlikely to desire amniocentesis even if Athena Bland is found to be a carrier due to the risks of the procedure.   Takenya declined workup today for acquired thrombophilia or chromosome translocations. She is already scheduled for an anatomy ultrasound through [facility] and is aware she can reach out for any questions as the pregnancy progresses.   We may be reached at [phone] with any questions or concerns.   In total, 70 minutes were spent on the date of the encounter in service to the patient including preparation, record review, face-to-face consultation, documentation and care coordination.  Purnell Bruch, MS, CGC

## 2024-03-13 ENCOUNTER — Telehealth: Payer: Self-pay

## 2024-03-13 NOTE — Telephone Encounter (Signed)
 Spoke with [name] DOB 09/06/1990 to return his Horizon carrier screening results. He was not found to be a carrier for the 12 conditions screened for, including CF. This significantly reduces but does not eliminate the chance that he is a carrier for those conditions. Please see report for details.  [name], MS, [facility] Certified Genetic Counselor [facility] [phone]

## 2024-03-14 ENCOUNTER — Ambulatory Visit

## 2024-03-14 ENCOUNTER — Ambulatory Visit (INDEPENDENT_AMBULATORY_CARE_PROVIDER_SITE_OTHER): Admitting: Certified Nurse Midwife

## 2024-03-14 VITALS — BP 126/59 | HR 79 | Wt 137.9 lb

## 2024-03-14 DIAGNOSIS — Z3A19 19 weeks gestation of pregnancy: Secondary | ICD-10-CM | POA: Diagnosis not present

## 2024-03-14 DIAGNOSIS — O0990 Supervision of high risk pregnancy, unspecified, unspecified trimester: Secondary | ICD-10-CM | POA: Diagnosis not present

## 2024-03-14 DIAGNOSIS — Z363 Encounter for antenatal screening for malformations: Secondary | ICD-10-CM | POA: Diagnosis not present

## 2024-03-14 NOTE — Assessment & Plan Note (Signed)
 Red flag symptoms reviewed.

## 2024-03-14 NOTE — Patient Instructions (Signed)
 Second Trimester of Pregnancy  The second trimester of pregnancy is from week 14 through week 27. This is months 4 through 6 of pregnancy. During the second trimester: Morning sickness is less or has stopped. You may have more energy. You may feel hungry more often. At this time, your unborn baby is growing very fast. At the end of the sixth month, the unborn baby may be up to 12 inches long and weigh about 1 pounds. You will likely start to feel the baby move between 16 and 20 weeks of pregnancy. Body changes during your second trimester Your body continues to change during this time. The changes usually go away after your baby is born. Physical changes You will gain more weight. Your belly will get bigger. You may begin to get stretch marks on your hips, belly, and breasts. Your breasts will keep growing and may hurt. You may get dark spots or blotches on your face. A dark line from your belly button to the pubic area may appear. This line is called linea nigra. Your hair may grow faster and get thicker. Health changes You may have headaches. You may have heartburn. You may pee more often. You may have swollen, bulging veins (varicose veins). You may have trouble pooping (constipation), or swollen veins in the butt that can itch or get painful (hemorrhoids). You may have back pain. This is caused by: Weight gain. Pregnancy hormones that are relaxing the joints in your pelvis. Follow these instructions at home: Medicines Talk to your health care provider if you're taking medicines. Ask if the medicines are safe to take during pregnancy. Your provider may change the medicines that you take. Do not take any medicines unless told to by your provider. Take a prenatal vitamin that has at least 600 micrograms (mcg) of folic acid. Do not use herbal medicines, illegal drugs, or medicines that are not approved by your provider. Eating and drinking While you're pregnant your body needs  extra food for your growing baby. Talk with your provider about what to eat while pregnant. Activity Most women are able to exercise during pregnancy. Exercises may need to change as your pregnancy goes on. Talk to your provider about your activities and exercise routines. Relieving pain and discomfort Wear a good, supportive bra if your breasts hurt. Rest with your legs raised if you have leg cramps or low back pain. Take warm sitz baths to soothe pain from hemorrhoids. Use hemorrhoid cream if your provider says it's okay. Do not douche. Do not use tampons or scented pads. Do not use hot tubs, steam rooms, or saunas. Safety Wear your seatbelt at all times when you're in a car. Talk to your provider if someone hits you, hurts you, or yells at you. Talk with your provider if you're feeling sad or have thoughts of hurting yourself. Lifestyle Certain things can be harmful while you're pregnant. It's best to avoid the following: Do not drink alcohol,smoke, vape, or use products with nicotine or tobacco in them. If you need help quitting, talk with your provider. Avoid cat litter boxes and soil used by cats. These things carry germs that can cause harm to your pregnancy and your baby. General instructions Keep all follow-up visits. It helps you and your unborn baby stay as healthy as possible. Write down your questions. Take them to your prenatal visits. Your provider will: Talk with you about your overall health. Give you advice or refer you to specialists who can help with different needs,  including: Prenatal education classes. Mental health and counseling. Foods and healthy eating. Ask for help if you need help with food. Where to find more information American Pregnancy Association: americanpregnancy.org [employer] of Obstetricians and Gynecologists: acog.org Office on [employer] Health: TravelLesson.ca Contact a health care provider if: You have a headache that does not go away  when you take medicine. You have any of these problems: You can't eat or drink. You throw up or feel like you may throw up. You have watery poop (diarrhea) for 2 days or more. You have pain when you pee or your pee smells bad. You have been sick for 2 days or more and are not getting better. Contact your provider right away if: You have any of these coming from your vagina: Abnormal discharge. Bad-smelling fluid. Bleeding. Your baby is moving less than usual. You have contractions, belly cramping, or have pain in your pelvis or lower back. You have symptoms of high blood pressure or preeclampsia. These include: A severe, throbbing headache that does not go away. Sudden or extreme swelling of your face, hands, legs, or feet. Vision problems: You see spots. You have blurry vision. Your eyes are sensitive to light. If you can't reach the provider, go to an urgent care or emergency room. Get help right away if: You faint, become confused, or can't think clearly. You have chest pain or trouble breathing. You have any kind of injury, such as from a fall or a car crash. These symptoms may be an emergency. Call 911 right away. Do not wait to see if the symptoms will go away. Do not drive yourself to the hospital. This information is not intended to replace advice given to you by your health care provider. Make sure you discuss any questions you have with your health care provider. Document Revised: 07/26/2023 Document Reviewed: 02/23/2023 Elsevier Patient Education  2024 [employer].

## 2024-03-14 NOTE — Progress Notes (Signed)
    Return Prenatal Note   Subjective   33 y.o. [id number] at [redacted]w[redacted]d presents for this follow-up prenatal visit.  Patient feeling well, partner screened & is not a CF carrier. Anatomy ultrasound today, no anomalies noted. Questions answered about vitamin K after birth. Patient reports: Movement: Absent Contractions: Not present  Objective   Flow sheet Vitals: Pulse Rate: 79 BP: (!) 126/59 Fundal Height:  (@U ) Fetal Heart Rate (bpm): 145 Total weight gain: 22 lb 14.4 oz (10.4 kg)  General Appearance  No acute distress, well appearing, and well nourished Pulmonary   Normal work of breathing Neurologic   Alert and oriented to person, place, and time Psychiatric   Mood and affect within normal limits  Assessment/Plan   Plan  33 y.o. [id number] at [redacted]w[redacted]d presents for follow-up OB visit. Reviewed prenatal record including previous visit note.  Supervision of high risk pregnancy, antepartum Red flag symptoms reviewed.      No orders of the defined types were placed in this encounter.  Return in 4 weeks (on 04/11/2024) for ROB.   No future appointments.   For next visit:  continue with routine prenatal care     Forestine Igo, CNM  03/14/2510:24 AM

## 2024-03-17 ENCOUNTER — Encounter: Admitting: Obstetrics

## 2024-03-17 ENCOUNTER — Other Ambulatory Visit

## 2024-04-11 ENCOUNTER — Ambulatory Visit: Admitting: Obstetrics

## 2024-04-11 VITALS — BP 98/51 | HR 85 | Wt 146.0 lb

## 2024-04-11 DIAGNOSIS — Z141 Cystic fibrosis carrier: Secondary | ICD-10-CM | POA: Diagnosis not present

## 2024-04-11 DIAGNOSIS — Z3A23 23 weeks gestation of pregnancy: Secondary | ICD-10-CM | POA: Diagnosis not present

## 2024-04-11 DIAGNOSIS — F17210 Nicotine dependence, cigarettes, uncomplicated: Secondary | ICD-10-CM | POA: Diagnosis not present

## 2024-04-11 DIAGNOSIS — Z720 Tobacco use: Secondary | ICD-10-CM

## 2024-04-11 DIAGNOSIS — Z131 Encounter for screening for diabetes mellitus: Secondary | ICD-10-CM

## 2024-04-11 DIAGNOSIS — O0990 Supervision of high risk pregnancy, unspecified, unspecified trimester: Secondary | ICD-10-CM

## 2024-04-11 DIAGNOSIS — O99332 Smoking (tobacco) complicating pregnancy, second trimester: Secondary | ICD-10-CM

## 2024-04-11 NOTE — Patient Instructions (Signed)
 Glucose Tolerance Test Why am I having this test? The glucose tolerance test (GTT) is done to check how your body processes sugar (glucose). This is one of several tests used to diagnose diabetes (diabetes mellitus). Your health care provider may recommend this test if you: Have a family history of diabetes. Are obese. Have infections that keep coming back. Have had a lot of wounds that did not heal quickly, especially on your legs and feet. Are a woman and have a history of giving birth to very large babies or a history of repeated fetal loss (stillbirth). Have had high glucose levels in your urine or blood: During a past pregnancy. After a heart attack, surgery, or prolonged periods of high stress. What is being tested? This test measures the amount of glucose in your blood at different times during a period of 2 hours. This indicates how well your body is able to process glucose. What kind of sample is taken?  Blood samples are required for this test. They are usually collected by inserting a needle into a blood vessel. How do I prepare for this test? For 3 days before your test, eat normally. Make sure to eat enough carbohydrates (at least 150 grams per day). Follow instructions from your health care provider about: Eating or drinking restrictions on the day of the test. You may be asked to not eat or drink anything other than water (fast) starting 8-12 hours before the test. Changing or stopping your regular medicines. Some medicines may interfere with this test. Tell a health care provider about: All medicines you are taking, including vitamins, herbs, eye drops, creams, and over-the-counter medicines. Any blood disorders you have. Any surgeries you have had. Any medical conditions you have. Whether you are pregnant or may be pregnant. What happens during the test? First, your blood glucose will be measured. This is your fasting blood glucose since you fasted before the test. Then,  you will drink a glucose solution containing a specific amount of glucose. Your blood glucose will be measured again 1 and 2 hours after drinking the solution. This test takes 2 hours to complete. You will need to stay at the testing location during this time. During the testing period: Do not eat or drink anything other than the glucose solution. You will be allowed to drink water. Do not exercise. Do not use any products that contain nicotine or tobacco. These products include cigarettes, chewing tobacco, and vaping devices, such as e-cigarettes. If you need help quitting, ask your health care provider. The testing procedure may vary among health care providers and hospitals. How are the results reported? Your test results will be reported as values. These will be given as milligrams of glucose per deciliter of blood (mg/dL) or millimoles per liter (mmol/L). Your health care provider will compare your results to normal ranges that were established after testing a large group of people (reference ranges). Reference ranges may vary among labs and hospitals. For this test, common reference ranges are: Fasting: less than 110 mg/dL (6.1 mmol/L). 1 hour after drinking glucose: less than 180 mg/dL (16.1 mmol/L). 2 hours after drinking glucose: less than 140 mg/dL (7.8 mmol/L). What do the results mean? Results within the reference ranges are considered normal, meaning your glucose levels are normal. Results higher than the reference ranges may indicate you have diabetes. Talk with your health care provider about what your results mean. Questions to ask your health care provider Ask your health care provider, or the department that  is doing the test: When will my results be ready? How will I get my results? What are my treatment options? What other tests do I need? What are my next steps? Summary The GTT is done to check how your body processes sugar (glucose). This is one of several tests used to  diagnose diabetes. This test measures the amount of glucose in your blood at different times during a period of 2 hours. This indicates how well your body is able to process glucose. Talk with your health care provider about what your results mean. This information is not intended to replace advice given to you by your health care provider. Make sure you discuss any questions you have with your health care provider. Document Revised: 05/30/2022 Document Reviewed: 12/07/2021 Elsevier Patient Education  2024 [employer].

## 2024-04-11 NOTE — Progress Notes (Signed)
    Return Prenatal Note   Subjective  33 y.o. [id number] at [redacted]w[redacted]d presents for this follow-up prenatal visit.   Patient feeling good today, enjoying the increased fetal movement. Will be her fiance's first baby and he is supportive. Wanting unmedicated delivery, did this last birth successfully. Has been sober and off Suboxone  since 2019. Does smoke.  Patient reports: Movement: Present Contractions: Not present Denies vaginal bleeding or leaking fluid. Objective  Flow sheet Vitals: Pulse Rate: 85 BP: (!) 98/51 Fundal Height: 23 cm Fetal Heart Rate (bpm): 138 Total weight gain: 31 lb (14.1 kg)  General Appearance  No acute distress, well appearing, and well nourished Pulmonary   Normal work of breathing Neurologic   Alert and oriented to person, place, and time Psychiatric   Mood and affect within normal limits   Assessment/Plan   Plan  33 y.o. [id number] at [redacted]w[redacted]d by 7wk US  presents for follow-up OB visit. Reviewed prenatal record including previous visit note. 1. Supervision of high risk pregnancy, antepartum -28 wk labs ordered for next visit, instructions reviewed -Discussed Tdap and pt hesitant; will consider  2. Tobacco use -Cessation encouraged  3. Cystic fibrosis carrier -Partner is negative; s/p genetics consult 4/30   Orders Placed This Encounter  Procedures   28 Week RH+Panel    Standing Status:   Future    Expected Date:   05/11/2024    Expiration Date:   06/11/2024   Return in about 1 month (around 05/11/2024) for rob & 28 week labs gtt.   Future Appointments  Date Time Provider Department Center  05/08/2024  8:40 AM [facility] None  05/08/2024  9:35 AM Aisha Ali, Barbra Boone, CNM [facility] None    For next visit:  continue with routine prenatal care    Sofia Dunn, DO [facility]

## 2024-04-22 ENCOUNTER — Encounter: Payer: Self-pay | Admitting: Certified Nurse Midwife

## 2024-04-22 DIAGNOSIS — O2340 Unspecified infection of urinary tract in pregnancy, unspecified trimester: Secondary | ICD-10-CM

## 2024-04-25 ENCOUNTER — Observation Stay
Admission: EM | Admit: 2024-04-25 | Discharge: 2024-04-25 | Disposition: A | Discharge status: Home / Self Care | Attending: Certified Nurse Midwife | Admitting: Certified Nurse Midwife

## 2024-04-25 ENCOUNTER — Encounter: Payer: Self-pay | Admitting: Obstetrics and Gynecology

## 2024-04-25 ENCOUNTER — Other Ambulatory Visit: Payer: Self-pay

## 2024-04-25 DIAGNOSIS — O219 Vomiting of pregnancy, unspecified: Secondary | ICD-10-CM | POA: Insufficient documentation

## 2024-04-25 DIAGNOSIS — M5459 Other low back pain: Secondary | ICD-10-CM | POA: Insufficient documentation

## 2024-04-25 DIAGNOSIS — Z3A25 25 weeks gestation of pregnancy: Secondary | ICD-10-CM | POA: Diagnosis not present

## 2024-04-25 DIAGNOSIS — O26892 Other specified pregnancy related conditions, second trimester: Secondary | ICD-10-CM | POA: Diagnosis present

## 2024-04-25 DIAGNOSIS — O0990 Supervision of high risk pregnancy, unspecified, unspecified trimester: Principal | ICD-10-CM

## 2024-04-25 LAB — URINALYSIS, COMPLETE (UACMP) WITH MICROSCOPIC
Bilirubin Urine: NEGATIVE
Glucose, UA: NEGATIVE mg/dL
Hgb urine dipstick: NEGATIVE
Ketones, ur: NEGATIVE mg/dL
Nitrite: NEGATIVE
Protein, ur: NEGATIVE mg/dL
Specific Gravity, Urine: 1.013 (ref 1.005–1.030)
pH: 7 (ref 5.0–8.0)

## 2024-04-25 LAB — WET PREP, GENITAL
Sperm: NONE SEEN
Trich, Wet Prep: NONE SEEN
WBC, Wet Prep HPF POC: <10 (ref <10)
Yeast Wet Prep HPF POC: NONE SEEN

## 2024-04-25 LAB — CHLAMYDIA/NGC RT PCR (ARMC ONLY)
Chlamydia Tr: NOT DETECTED
N gonorrhoeae: NOT DETECTED

## 2024-04-25 MED ORDER — CYCLOBENZAPRINE HCL 5 MG PO TABS
10.0000 mg | ORAL_TABLET | Freq: Once | ORAL | Status: DC
  Filled 2024-04-25: qty 2

## 2024-04-25 MED ORDER — METRONIDAZOLE 500 MG PO TABS
500.0000 mg | ORAL_TABLET | Freq: Once | ORAL | Status: AC
  Administered 2024-04-25: 500 mg via ORAL
  Filled 2024-04-25: qty 1

## 2024-04-25 MED ORDER — ONDANSETRON 4 MG PO TBDP
8.0000 mg | ORAL_TABLET | Freq: Once | ORAL | Status: AC
  Administered 2024-04-25: 8 mg via ORAL
  Filled 2024-04-25: qty 2

## 2024-04-25 MED ORDER — METRONIDAZOLE 500 MG/100ML IV SOLN
500.0000 mg | Freq: Once | INTRAVENOUS | Status: DC
  Filled 2024-04-25: qty 100

## 2024-04-25 MED ORDER — LACTATED RINGERS IV BOLUS
1000.0000 mL | Freq: Once | INTRAVENOUS | Status: AC
  Administered 2024-04-25: 1000 mL via INTRAVENOUS

## 2024-04-25 MED ORDER — METRONIDAZOLE 500 MG PO TABS: 500.0000 mg | ORAL_TABLET | Freq: Two times a day (BID) | ORAL | 0 refills | Status: DC

## 2024-04-25 NOTE — Progress Notes (Signed)
 Pt presents to L/D triage with reported mid-lower back pain that is intermittent, rated 9/10, that has been ongoing for a few days and is sometimes worsened with certain positions.  She also reports ongoing nausea, vomiting, and diarrhea that last two days. Pt reports not being in contact with anyone sick and reports no bleeding or LOF. Pt  Pt reports white/yellow vaginal discharge and no urinary symptoms. Positive fetal movement.  VSS.  Toco applied and FHT by doppler 150.  CNM notified of patient's arrival.

## 2024-04-25 NOTE — OB Triage Note (Signed)
 LABOR & DELIVERY OB TRIAGE NOTE  SUBJECTIVE  HPI Betty Powell is a 33 y.o. [id number] at [redacted]w[redacted]d who presents to Labor & Delivery for back pain, nausea, vomiting and diarrhea. Her back pain has been going on for one week and her GI symptoms started today. She denies urinary symptoms or fever.   OB History     Gravida  8   Para  2   Term  2   Preterm  0   AB  5   Living  2      SAB  4   IAB  0   Ectopic  0   Multiple  0   Live Births  2           Scheduled Meds:  cyclobenzaprine   10 mg Oral Once   Continuous Infusions: PRN Meds:.  OBJECTIVE  BP (!) 110/58 (BP Location: Left Arm)   Pulse 92   Temp 98.7 F (37.1 C) (Oral)   Resp 18   Ht 5' 4 (1.626 m)   Wt 68 kg   LMP 10/15/2023 (Exact Date)   BMI 25.75 kg/m   General: A7O x3 Lungs: normal work of breathing Abdomen: soft non-tender Cervical exam:   deferred   1) Pregnancy at [id number], [redacted]w[redacted]d, Estimated Date of Delivery: 08/08/24 2) Reassuring maternal/fetal status 3)Wet prep positive for BV 4)Back pain improved with heat but still remains.   PLAN 1) Will start antibiotics for BV. 2) back pain likely muscle strain. Relief measures reviewed with patient. She would rather avoid pain medications. Reviewed more natural remedies.  3)Return with fever or worsening symptoms.   Donato Fu, CNM  04/25/24  8:42 PM

## 2024-04-27 LAB — URINE CULTURE: Culture: 60000 — AB

## 2024-04-28 MED ORDER — NITROFURANTOIN MONOHYD MACRO 100 MG PO CAPS
100.0000 mg | ORAL_CAPSULE | Freq: Two times a day (BID) | ORAL | 0 refills | Status: AC
Start: 2024-04-28 — End: ?

## 2024-05-08 ENCOUNTER — Other Ambulatory Visit

## 2024-05-08 ENCOUNTER — Ambulatory Visit (INDEPENDENT_AMBULATORY_CARE_PROVIDER_SITE_OTHER): Admitting: Obstetrics

## 2024-05-08 ENCOUNTER — Encounter: Payer: Self-pay | Admitting: Obstetrics

## 2024-05-08 VITALS — BP 114/65 | HR 86 | Wt 153.0 lb

## 2024-05-08 DIAGNOSIS — Z8742 Personal history of other diseases of the female genital tract: Secondary | ICD-10-CM

## 2024-05-08 DIAGNOSIS — O0992 Supervision of high risk pregnancy, unspecified, second trimester: Secondary | ICD-10-CM

## 2024-05-08 DIAGNOSIS — O0990 Supervision of high risk pregnancy, unspecified, unspecified trimester: Secondary | ICD-10-CM

## 2024-05-08 DIAGNOSIS — Z131 Encounter for screening for diabetes mellitus: Secondary | ICD-10-CM

## 2024-05-08 DIAGNOSIS — Z3A26 26 weeks gestation of pregnancy: Secondary | ICD-10-CM

## 2024-05-08 NOTE — Progress Notes (Signed)
    Return Prenatal Note   Assessment/Plan   Plan  33 y.o. [id number] at [redacted]w[redacted]d presents for follow-up OB visit. Reviewed prenatal record including previous visit note.  Supervision of high risk pregnancy, antepartum -28-week labs today -Discussed comfort measures for hemorrhoids including Tucks pad, Preparation H, ice pack -Plans unmedicated birth -Referred to [facility] for info on vitamin K -Reviewed kick counts and preterm labor warning signs. Instructed to call office or come to hospital with persistent headache, vision changes, regular contractions, leaking of fluid, decreased fetal movement or vaginal bleeding.     No orders of the defined types were placed in this encounter.  Return in about 3 weeks (around 05/29/2024).   Future Appointments  Date Time Provider Department Center  05/29/2024  9:35 AM Betty Powell, CNM [facility] None    For next visit:  Routine prenatal care    Subjective   Betty Powell is having very painful hemorrhoids that started a few days ago. She doing well otherwise.   Movement: Present Contractions: Irritability  Objective   Flow sheet Vitals: Pulse Rate: 86 BP: 114/65 Fundal Height: 25 cm Fetal Heart Rate (bpm): 148 Total weight gain: 38 lb (17.2 kg)  General Appearance  No acute distress, well appearing, and well nourished Pulmonary   Normal work of breathing Neurologic   Alert and oriented to person, place, and time Psychiatric   Mood and affect within normal limits  Eleanor Canny, CNM 05/08/24 12:04 PM

## 2024-05-08 NOTE — Assessment & Plan Note (Addendum)
-  28-week labs today -Discussed comfort measures for hemorrhoids including Tucks pad, Preparation H, ice pack -Plans unmedicated birth -Referred to [facility] for info on vitamin K -Reviewed kick counts and preterm labor warning signs. Instructed to call office or come to hospital with persistent headache, vision changes, regular contractions, leaking of fluid, decreased fetal movement or vaginal bleeding.

## 2024-05-09 LAB — 28 WEEK RH+PANEL
Basophils Absolute: 0.1 x10E3/uL (ref 0.0–0.2)
Basos: 1 %
EOS (ABSOLUTE): 0.2 x10E3/uL (ref 0.0–0.4)
Eos: 2 %
Gestational Diabetes Screen: 115 mg/dL (ref 70–139)
HIV Screen 4th Generation wRfx: NONREACTIVE
Hematocrit: 31.5 % — ABNORMAL LOW (ref 34.0–46.6)
Hemoglobin: 10.5 g/dL — ABNORMAL LOW (ref 11.1–15.9)
Immature Grans (Abs): 0.1 x10E3/uL (ref 0.0–0.1)
Immature Granulocytes: 1 %
Lymphocytes Absolute: 1.7 x10E3/uL (ref 0.7–3.1)
Lymphs: 16 %
MCH: 34.9 pg — ABNORMAL HIGH (ref 26.6–33.0)
MCHC: 33.3 g/dL (ref 31.5–35.7)
MCV: 105 fL — ABNORMAL HIGH (ref 79–97)
Monocytes Absolute: 0.6 x10E3/uL (ref 0.1–0.9)
Monocytes: 6 %
Neutrophils Absolute: 8 x10E3/uL — ABNORMAL HIGH (ref 1.4–7.0)
Neutrophils: 74 %
Platelets: 228 x10E3/uL (ref 150–450)
RBC: 3.01 x10E6/uL — ABNORMAL LOW (ref 3.77–5.28)
RDW: 11.5 % — ABNORMAL LOW (ref 11.7–15.4)
RPR Ser Ql: NONREACTIVE
WBC: 10.6 x10E3/uL (ref 3.4–10.8)

## 2024-05-12 ENCOUNTER — Ambulatory Visit: Payer: Self-pay | Admitting: Obstetrics

## 2024-05-12 ENCOUNTER — Other Ambulatory Visit: Payer: Self-pay | Admitting: Obstetrics

## 2024-05-12 DIAGNOSIS — O99013 Anemia complicating pregnancy, third trimester: Secondary | ICD-10-CM

## 2024-05-12 MED ORDER — FERROUS SULFATE 325 (65 FE) MG PO TABS: 325.0000 mg | ORAL_TABLET | Freq: Every day | ORAL | 1 refills | Status: AC

## 2024-05-12 MED ORDER — POLYETHYLENE GLYCOL 3350 17 G PO PACK: 17.0000 g | PACK | Freq: Every day | ORAL | 1 refills | Status: AC

## 2024-05-27 NOTE — Patient Instructions (Signed)
 Third Trimester of Pregnancy  The third trimester of pregnancy is from week 28 through week 40. This is months 7 through 9. The third trimester is a time when your baby is growing fast. Body changes during your third trimester Your body continues to change during this time. The changes usually go away after your baby is born. Physical changes You will continue to gain weight. You may get stretch marks on your hips, belly, and breasts. Your breasts will keep growing and may hurt. A yellow fluid (colostrum) may leak from your breasts. This is the first milk you're making for your baby. Your hair may grow faster and get thicker. In some cases, you may get hair loss. Your belly button may stick out. You may have more swelling in your hands, face, or ankles. Health changes You may have heartburn. You may feel short of breath. This is caused by the uterus that is now bigger. You may have more aches in the pelvis, back, or thighs. You may have more tingling or numbness in your hands, arms, and legs. You may pee more often. You may have trouble pooping (constipation) or swollen veins in the butt that can itch or get painful (hemorrhoids). Other changes You may have more problems sleeping. You may notice the baby moving lower in your belly (dropping). You may have more fluid coming from your vagina. Your joints may feel loose, and you may have pain around your pelvic bone. Follow these instructions at home: Medicines Take medicines only as told by your health care provider. Some medicines are not safe during pregnancy. Your provider may change the medicines that you take. Do not take any medicines unless told to by your provider. Take a prenatal vitamin that has at least 600 micrograms (mcg) of folic acid. Do not use herbal medicines, illegal drugs, or medicines that are not approved by your provider. Eating and drinking While you're pregnant your body needs additional nutrition to help  support your growing baby. Talk with your provider about your nutritional needs. Activity Most women are able to exercise regularly during pregnancy. Exercise routines may need to change at the end of your pregnancy. Talk to your provider about your activities and exercise routine. Relieving pain and discomfort Rest often with your legs raised if you have leg cramps or low back pain. Take warm sitz baths to soothe pain from hemorrhoids. Use hemorrhoid cream if your provider says it's okay. Wear a good, supportive bra if your breasts hurt. Do not use hot tubs, steam rooms, or saunas. Do not douche. Do not use tampons or scented pads. Safety Talk to your provider before traveling far distances. Wear your seatbelt at all times when you're in a car. Talk to your provider if someone hits you, hurts you, or yells at you. Preparing for birth To prepare for your baby: Take childbirth and breastfeeding classes. Visit the hospital and tour the maternity area. Buy a rear-facing car seat. Learn how to install it in your car. General instructions Avoid cat litter boxes and soil used by cats. These things carry germs that can cause harm to your pregnancy and your baby. Do not drink alcohol, smoke, vape, or use products with nicotine or tobacco in them. If you need help quitting, talk with your provider. Keep all follow-up visits for your third trimester. Your provider will do more exams and tests during this trimester. Write down your questions. Take them to your prenatal visits. Your provider also will: Talk with you about  your overall health. Give you advice or refer you to specialists who can help with different needs, including: Mental health and counseling. Foods and healthy eating. Ask for help if you need help with food. Where to find more information American Pregnancy Association: americanpregnancy.org [employer]: acog.org Office on [employer] Health:  [employer] Contact a health care provider if: You have a headache that does not go away when you take medicine. You have any of these problems: You can't eat or drink. You have nausea and vomiting. You have watery poop (diarrhea) for 2 days or more. You have pain when you pee, or your pee smells bad. You have been sick for 2 days or more and aren't getting better. Contact your provider right away if: You have any of these coming from your vagina: Abnormal discharge. Bad-smelling fluid. Bleeding. Your baby is moving less than usual. You have signs of labor: You have any contractions, belly cramping, or have pain in your pelvis or lower back before 37 weeks of pregnancy (preterm labor). You have regular contractions that are less than 5 minutes apart. Your water breaks. You have symptoms of high blood pressure or preeclampsia. These include: A severe, throbbing headache that does not go away. Sudden or extreme swelling of your face, hands, legs, or feet. Vision problems: You see spots. You have blurry vision. Your eyes are sensitive to light. If you can't reach your provider, go to an urgent care or emergency room. Get help right away if: You faint, become confused, or can't think clearly. You have chest pain or trouble breathing. You have any kind of injury, such as from a fall or a car crash. These symptoms may be an emergency. Call 911 right away. Do not wait to see if the symptoms will go away. Do not drive yourself to the hospital. This information is not intended to replace advice given to you by your health care provider. Make sure you discuss any questions you have with your health care provider. Document Revised: 07/26/2023 Document Reviewed: 02/23/2023 Elsevier Patient Education  2024 [employer].

## 2024-05-29 ENCOUNTER — Encounter: Payer: Self-pay | Admitting: Advanced Practice Midwife

## 2024-05-29 ENCOUNTER — Ambulatory Visit (INDEPENDENT_AMBULATORY_CARE_PROVIDER_SITE_OTHER): Admitting: Advanced Practice Midwife

## 2024-05-29 VITALS — BP 111/63 | HR 98 | Wt 160.9 lb

## 2024-05-29 DIAGNOSIS — Z3A29 29 weeks gestation of pregnancy: Secondary | ICD-10-CM | POA: Diagnosis not present

## 2024-05-29 DIAGNOSIS — O0993 Supervision of high risk pregnancy, unspecified, third trimester: Secondary | ICD-10-CM

## 2024-05-29 DIAGNOSIS — Z3483 Encounter for supervision of other normal pregnancy, third trimester: Secondary | ICD-10-CM

## 2024-05-29 NOTE — Progress Notes (Signed)
 Routine Prenatal Care Visit  Subjective  Betty Powell is a 33 y.o. H1E7947 at [redacted]w[redacted]d being seen today for ongoing prenatal care.  She is currently monitored for the following issues for this low-risk pregnancy and has History of abnormal cervical Pap smear; Cystic fibrosis carrier; Supervision of normal pregnancy; and Tobacco use on their problem list.  ----------------------------------------------------------------------------------- Patient reports occasional sharp pains on right lower abdomen- fetal position vs round ligament. Reviewed comfort measures and suggested stretches/exercises, abdominal support band, shower/bath.   Contractions: Not present. Vag. Bleeding: None.  Movement: Present. Leaking Fluid denies.  ----------------------------------------------------------------------------------- The following portions of the patient's history were reviewed and updated as appropriate: allergies, current medications, past family history, past medical history, past social history, past surgical history and problem list. Problem list updated.  Objective  Blood pressure 111/63, pulse 98, weight 160 lb 14.4 oz (73 kg), last menstrual period 10/15/2023. Pregravid weight 115 lb (52.2 kg) Total Weight Gain 45 lb 14.4 oz (20.8 kg) Urinalysis: Urine Protein    Urine Glucose    Fetal Status: Fetal Heart Rate (bpm): 151 Fundal Height: 29 cm Movement: Present     General:  Alert, oriented and cooperative. Patient is in no acute distress.  Skin: Skin is warm and dry. No rash noted.   Cardiovascular: Normal heart rate noted  Respiratory: Normal respiratory effort, no problems with respiration noted  Abdomen: Soft, gravid, appropriate for gestational age. Pain/Pressure: Present     Pelvic:  Cervical exam deferred        Extremities: Normal range of motion.  Edema: None  Mental Status: Normal mood and affect. Normal behavior. Normal judgment and thought content.   Assessment   33 y.o. H1E7947 at  [redacted]w[redacted]d by  08/08/2024, by Ultrasound presenting for routine prenatal visit  Plan   February 2025 Problems (from 12/10/23 to present)     Problem Noted Diagnosed Resolved   Supervision of normal pregnancy 12/10/2023 by Alaina Waddell SAUNDERS, CMA  No   Overview Addendum 05/29/2024  9:31 AM by Taft Salines, LPN   Clinical Staff Provider  Office Location  [facility] Dating  07/21/2024, Date entered prior to episode creation  Language  English Anatomy US     Flu Vaccine  declines Genetic Screen  NIPS: XY  TDaP vaccine   declines Hgb A1C or  GTT Early : Third trimester : 115  Covid declines   LAB RESULTS   Rhogam  O/Positive/-- (03/17 1029)  Blood Type O/Positive/-- (03/17 1029)   RSV N/A Antibody Comment, See Final Results (03/17 1029)  Feeding Plan breast Rubella 2.02 (03/17 1029)  Contraception NFP RPR Non Reactive (03/17 1029)   Circumcision yes HBsAg Negative (03/17 1029)   Pediatrician  [facility] HIV Non Reactive (03/17 1029)  Support Person Ozell Kras Varicella Reactive (03/17 1029)  Prenatal Classes maybe GBS  (For PCN allergy, check sensitivities)     Hep C Non Reactive (03/17 1029)   BTL Consent  Pap Diagnosis  Date Value Ref Range Status  11/30/2020   Final   - Negative for intraepithelial lesion or malignancy (NILM)    VBAC Consent VBAC Hgb Electro      CF      SMA                    Preterm labor symptoms and general obstetric precautions including but not limited to vaginal bleeding, contractions, leaking of fluid and fetal movement were reviewed in detail with the patient. Please refer to After  Visit Summary for other counseling recommendations.   Return in about 2 weeks (around 06/12/2024) for rob.  Slater Rains, CNM 05/29/2024 9:50 AM

## 2024-06-11 NOTE — Patient Instructions (Signed)
 Fetal Movement Counts When you're pregnant, you might start feeling your baby move around the middle of your pregnancy. At first, these movements might feel like flutters, rolls, or swishes. As your baby grows, you might feel more kicks and jabs. Around week 28 of your pregnancy, your health care team may ask you to count how often your baby moves. This is important for all pregnancies, but especially for high-risk ones. Counting movements can help lessen the risk of stillbirth. What is a fetal movement count? A fetal movement count is the number of times that you feel your baby move during a certain amount of time. This may also be called a kick count. There are many ways to do a kick count. Ask your team what is best for you. Pay attention to when your baby is most active. You may notice your baby's sleep and wake cycles. You may also notice things that make your baby move more. When you do a kick count, try to do it: When your baby is normally most active. At the same time each day. How do I count fetal movements?  Find a quiet, comfortable area. Sit or lie down. Write down the date, the start time, and the number of movements you feel. Count kicks, flutters, swishes, rolls, and jabs. Usually, you will feel at least 10 movements within 2 hours. Stop counting after you have felt 10 movements or if you have been counting for 2 hours. Write down the stop time. Contact a health care provider if: You don't feel 10 movements in 2 hours. Your baby isn't moving as it usually does. Your baby isn't moving at all. If you're not able to reach your provider, go to an emergency room. This information is not intended to replace advice given to you by your health care provider. Make sure you discuss any questions you have with your health care provider. Document Revised: 11/16/2023 Document Reviewed: 11/08/2022 Elsevier Patient Education  2025 Elsevier Inc.Third Trimester of Pregnancy  The third trimester  of pregnancy is from week 28 through week 40. This is months 7 through 9. The third trimester is a time when your baby is growing fast. Body changes during your third trimester Your body continues to change during this time. The changes usually go away after your baby is born. Physical changes You will continue to gain weight. You may get stretch marks on your hips, belly, and breasts. Your breasts will keep growing and may hurt. A yellow fluid (colostrum) may leak from your breasts. This is the first milk you're making for your baby. Your hair may grow faster and get thicker. In some cases, you may get hair loss. Your belly button may stick out. You may have more swelling in your hands, face, or ankles. Health changes You may have heartburn. You may feel short of breath. This is caused by the uterus that is now bigger. You may have more aches in the pelvis, back, or thighs. You may have more tingling or numbness in your hands, arms, and legs. You may pee more often. You may have trouble pooping (constipation) or swollen veins in the butt that can itch or get painful (hemorrhoids). Other changes You may have more problems sleeping. You may notice the baby moving lower in your belly (dropping). You may have more fluid coming from your vagina. Your joints may feel loose, and you may have pain around your pelvic bone. Follow these instructions at home: Medicines Take medicines only as told by  your health care provider. Some medicines are not safe during pregnancy. Your provider may change the medicines that you take. Do not take any medicines unless told to by your provider. Take a prenatal vitamin that has at least 600 micrograms (mcg) of folic acid. Do not use herbal medicines, illegal drugs, or medicines that are not approved by your provider. Eating and drinking While you're pregnant your body needs additional nutrition to help support your growing baby. Talk with your provider about  your nutritional needs. Activity Most women are able to exercise regularly during pregnancy. Exercise routines may need to change at the end of your pregnancy. Talk to your provider about your activities and exercise routine. Relieving pain and discomfort Rest often with your legs raised if you have leg cramps or low back pain. Take warm sitz baths to soothe pain from hemorrhoids. Use hemorrhoid cream if your provider says it's okay. Wear a good, supportive bra if your breasts hurt. Do not use hot tubs, steam rooms, or saunas. Do not douche. Do not use tampons or scented pads. Safety Talk to your provider before traveling far distances. Wear your seatbelt at all times when you're in a car. Talk to your provider if someone hits you, hurts you, or yells at you. Preparing for birth To prepare for your baby: Take childbirth and breastfeeding classes. Visit the hospital and tour the maternity area. Buy a rear-facing car seat. Learn how to install it in your car. General instructions Avoid cat litter boxes and soil used by cats. These things carry germs that can cause harm to your pregnancy and your baby. Do not drink alcohol, smoke, vape, or use products with nicotine  or tobacco in them. If you need help quitting, talk with your provider. Keep all follow-up visits for your third trimester. Your provider will do more exams and tests during this trimester. Write down your questions. Take them to your prenatal visits. Your provider also will: Talk with you about your overall health. Give you advice or refer you to specialists who can help with different needs, including: Mental health and counseling. Foods and healthy eating. Ask for help if you need help with food. Where to find more information American Pregnancy Association: americanpregnancy.org [employer]necologists: acog.org Office on [employer] Health: [employer] Contact a health care provider if: You  have a headache that does not go away when you take medicine. You have any of these problems: You can't eat or drink. You have nausea and vomiting. You have watery poop (diarrhea) for 2 days or more. You have pain when you pee, or your pee smells bad. You have been sick for 2 days or more and aren't getting better. Contact your provider right away if: You have any of these coming from your vagina: Abnormal discharge. Bad-smelling fluid. Bleeding. Your baby is moving less than usual. You have signs of labor: You have any contractions, belly cramping, or have pain in your pelvis or lower back before 37 weeks of pregnancy (preterm labor). You have regular contractions that are less than 5 minutes apart. Your water breaks. You have symptoms of high blood pressure or preeclampsia. These include: A severe, throbbing headache that does not go away. Sudden or extreme swelling of your face, hands, legs, or feet. Vision problems: You see spots. You have blurry vision. Your eyes are sensitive to light. If you can't reach your provider, go to an urgent care or emergency room. Get help right away if: You faint, become  confused, or can't think clearly. You have chest pain or trouble breathing. You have any kind of injury, such as from a fall or a car crash. These symptoms may be an emergency. Call 911 right away. Do not wait to see if the symptoms will go away. Do not drive yourself to the hospital. This information is not intended to replace advice given to you by your health care provider. Make sure you discuss any questions you have with your health care provider. Document Revised: 07/26/2023 Document Reviewed: 02/23/2023 Elsevier Patient Education  2024 [employer].

## 2024-06-11 NOTE — Progress Notes (Unsigned)
 Routine Prenatal Care Visit  Subjective  Betty Powell is a 33 y.o. [phone] at [redacted]w[redacted]d being seen today for ongoing prenatal care.  She is currently monitored for the following issues for this low-risk pregnancy and has History of abnormal cervical Pap smear; Cystic fibrosis carrier; Supervision of normal pregnancy; and Tobacco use on their problem list.  ----------------------------------------------------------------------------------- Patient reports some uterine irritability- irregular, short lasting contractions that she has to breathe through. Advised increase hydration should that continue. Also mentions some abdominal pain in left lower quadrant she thinks is due to fetal position. Having trouble getting comfortable to sleep. Comfort measures reviewed. [employer], tub soaks. Contractions: Irregular. Vag. Bleeding: None.  Movement: Present. Leaking Fluid denies.  ----------------------------------------------------------------------------------- The following portions of the patient's history were reviewed and updated as appropriate: allergies, current medications, past family history, past medical history, past social history, past surgical history and problem list. Problem list updated.  Objective  Blood pressure 105/61, pulse 91, weight 164 lb 8 oz (74.6 kg), last menstrual period 10/15/2023. Pregravid weight 115 lb (52.2 kg) Total Weight Gain 49 lb 8 oz (22.5 kg) Urinalysis: Urine Protein    Urine Glucose    Fetal Status: Fetal Heart Rate (bpm): 146 Fundal Height: 31 cm Movement: Present     General:  Alert, oriented and cooperative. Patient is in no acute distress.  Skin: Skin is warm and dry. No rash noted.   Cardiovascular: Normal heart rate noted  Respiratory: Normal respiratory effort, no problems with respiration noted  Abdomen: Soft, gravid, appropriate for gestational age. Pain/Pressure: Present (Left lower abdomen)     Pelvic:  Cervical exam deferred         Extremities: Normal range of motion.  Edema: None  Mental Status: Normal mood and affect. Normal behavior. Normal judgment and thought content.   Assessment   33 y.o. [id number] at [redacted]w[redacted]d by  08/08/2024, by Ultrasound presenting for routine prenatal visit  Plan   February 2025 Problems (from 12/10/23 to present)     Problem Noted Diagnosed Resolved   Supervision of normal pregnancy 12/10/2023 by Alaina Waddell SAUNDERS, CMA  No   Overview Addendum 06/12/2024 10:39 AM by Taft Salines, LPN   Clinical Staff Provider  Office Location  [facility] Dating  07/21/2024, Date entered prior to episode creation  Language  English Anatomy US     Flu Vaccine  declines Genetic Screen  NIPS: XY  TDaP vaccine  06/12/24 Hgb A1C or  GTT Early : Third trimester : 115  Covid declines   LAB RESULTS   Rhogam  O/Positive/-- (03/17 1029)  Blood Type O/Positive/-- (03/17 1029)   RSV N/A Antibody Comment, See Final Results (03/17 1029)  Feeding Plan breast Rubella 2.02 (03/17 1029)  Contraception NFP RPR Non Reactive (03/17 1029)   Circumcision yes HBsAg Negative (03/17 1029)   Pediatrician  [facility] HIV Non Reactive (03/17 1029)  Support Person Ozell Kras Varicella Reactive (03/17 1029)  Prenatal Classes no GBS  (For PCN allergy, check sensitivities)     Hep C Non Reactive (03/17 1029)   BTL Consent  Pap Diagnosis  Date Value Ref Range Status  11/30/2020   Final   - Negative for intraepithelial lesion or malignancy (NILM)    VBAC Consent VBAC Hgb Electro      CF      SMA                    Preterm labor symptoms and general obstetric precautions  including but not limited to vaginal bleeding, contractions, leaking of fluid and fetal movement were reviewed in detail with the patient. Please refer to After Visit Summary for other counseling recommendations.   Return in about 2 weeks (around 06/26/2024) for rob.  Slater Rains, CNM 06/12/2024 11:26 AM

## 2024-06-12 ENCOUNTER — Ambulatory Visit: Admitting: Advanced Practice Midwife

## 2024-06-12 ENCOUNTER — Encounter: Payer: Self-pay | Admitting: Advanced Practice Midwife

## 2024-06-12 VITALS — BP 105/61 | HR 91 | Wt 164.5 lb

## 2024-06-12 DIAGNOSIS — Z23 Encounter for immunization: Secondary | ICD-10-CM | POA: Diagnosis not present

## 2024-06-12 DIAGNOSIS — Z3A31 31 weeks gestation of pregnancy: Secondary | ICD-10-CM

## 2024-06-12 DIAGNOSIS — Z3483 Encounter for supervision of other normal pregnancy, third trimester: Secondary | ICD-10-CM | POA: Diagnosis not present

## 2024-06-27 ENCOUNTER — Encounter: Payer: Self-pay | Admitting: Advanced Practice Midwife

## 2024-06-27 ENCOUNTER — Ambulatory Visit (INDEPENDENT_AMBULATORY_CARE_PROVIDER_SITE_OTHER): Admitting: Advanced Practice Midwife

## 2024-06-27 VITALS — BP 92/63 | HR 86 | Wt 165.9 lb

## 2024-06-27 DIAGNOSIS — R399 Unspecified symptoms and signs involving the genitourinary system: Secondary | ICD-10-CM

## 2024-06-27 DIAGNOSIS — Z3483 Encounter for supervision of other normal pregnancy, third trimester: Secondary | ICD-10-CM | POA: Diagnosis not present

## 2024-06-27 DIAGNOSIS — Z3A34 34 weeks gestation of pregnancy: Secondary | ICD-10-CM | POA: Diagnosis not present

## 2024-06-27 LAB — POCT URINALYSIS DIPSTICK
Bilirubin, UA: NEGATIVE
Blood, UA: NEGATIVE
Glucose, UA: NEGATIVE
Ketones, UA: NEGATIVE
Leukocytes, UA: NEGATIVE
Nitrite, UA: NEGATIVE
Protein, UA: NEGATIVE
Spec Grav, UA: 1.01 (ref 1.010–1.025)
Urobilinogen, UA: 0.2 U/dL
pH, UA: 6 (ref 5.0–8.0)

## 2024-06-27 NOTE — Progress Notes (Signed)
 Routine Prenatal Care Visit  Subjective  Betty Powell is a 33 y.o. H1E7947 at [redacted]w[redacted]d being seen today for ongoing prenatal care.  She is currently monitored for the following issues for this low-risk pregnancy and has History of abnormal cervical Pap smear; Cystic fibrosis carrier; Supervision of normal pregnancy; and Tobacco use on their problem list.  ----------------------------------------------------------------------------------- Patient reports swelling in her legs and hands, some irregular contractions since the beginning of this week. Abdominal and back pain. They last 30 seconds and have been about 10-15 minutes apart some days throughout the day and other days just at bedtime. Having some visual spots and lightheaded today. She is feeling tired and stressed especially this past week. Recommend stay well hydrated, get extra rest, eat regular meals/snacks with protein at each. Urine dip today is normal. Labor precautions reviewed.   Contractions: Irregular. Vag. Bleeding: None.  Movement: Present. Leaking Fluid denies.  ----------------------------------------------------------------------------------- The following portions of the patient's history were reviewed and updated as appropriate: allergies, current medications, past family history, past medical history, past social history, past surgical history and problem list. Problem list updated.  Objective  Blood pressure 92/63, pulse 86, weight 165 lb 14.4 oz (75.3 kg), last menstrual period 10/15/2023. Pregravid weight 115 lb (52.2 kg) Total Weight Gain 50 lb 14.4 oz (23.1 kg) Urinalysis: Urine Protein    Urine Glucose    Fetal Status: Fetal Heart Rate (bpm): 127 Fundal Height: 34 cm Movement: Present     General:  Alert, oriented and cooperative. Patient is in no acute distress.  Skin: Skin is warm and dry. No rash noted.   Cardiovascular: Normal heart rate noted  Respiratory: Normal respiratory effort, no problems with  respiration noted  Abdomen: Soft, gravid, appropriate for gestational age. Pain/Pressure: Present     Pelvic:  Cervical exam deferred        Extremities: Normal range of motion.  Edema: Trace  Mental Status: Normal mood and affect. Normal behavior. Normal judgment and thought content.   Assessment   33 y.o. H1E7947 at [redacted]w[redacted]d by  08/08/2024, by Ultrasound presenting for routine prenatal visit  Plan   February 2025 Problems (from 12/10/23 to present)     Problem Noted Diagnosed Resolved   Supervision of normal pregnancy 12/10/2023 by Alaina Waddell SAUNDERS, CMA  No   Overview Addendum 06/12/2024 10:39 AM by Taft Salines, LPN   Clinical Staff Provider  Office Location  [facility] Dating  07/21/2024, Date entered prior to episode creation  Language  English Anatomy US     Flu Vaccine  declines Genetic Screen  NIPS: XY  TDaP vaccine  06/12/24 Hgb A1C or  GTT Early : Third trimester : 115  Covid declines   LAB RESULTS   Rhogam  O/Positive/-- (03/17 1029)  Blood Type O/Positive/-- (03/17 1029)   RSV N/A Antibody Comment, See Final Results (03/17 1029)  Feeding Plan breast Rubella 2.02 (03/17 1029)  Contraception NFP RPR Non Reactive (03/17 1029)   Circumcision yes HBsAg Negative (03/17 1029)   Pediatrician  [facility] HIV Non Reactive (03/17 1029)  Support Person Ozell Kras Varicella Reactive (03/17 1029)  Prenatal Classes no GBS  (For PCN allergy, check sensitivities)     Hep C Non Reactive (03/17 1029)   BTL Consent  Pap Diagnosis  Date Value Ref Range Status  11/30/2020   Final   - Negative for intraepithelial lesion or malignancy (NILM)    VBAC Consent VBAC Hgb Electro      CF  SMA                    Preterm labor symptoms and general obstetric precautions including but not limited to vaginal bleeding, contractions, leaking of fluid and fetal movement were reviewed in detail with the patient. Please refer to After Visit Summary for other counseling  recommendations.   Return in about 2 weeks (around 07/11/2024) for rob.  Slater Rains, CNM 06/27/2024 11:48 AM

## 2024-07-09 ENCOUNTER — Observation Stay
Admission: EM | Admit: 2024-07-09 | Discharge: 2024-07-09 | Disposition: A | Discharge status: Home / Self Care | Attending: Certified Nurse Midwife | Admitting: Certified Nurse Midwife

## 2024-07-09 ENCOUNTER — Other Ambulatory Visit: Payer: Self-pay

## 2024-07-09 ENCOUNTER — Encounter: Payer: Self-pay | Admitting: Obstetrics and Gynecology

## 2024-07-09 DIAGNOSIS — R102 Pelvic and perineal pain: Secondary | ICD-10-CM

## 2024-07-09 DIAGNOSIS — E860 Dehydration: Secondary | ICD-10-CM | POA: Diagnosis not present

## 2024-07-09 DIAGNOSIS — Z3A35 35 weeks gestation of pregnancy: Secondary | ICD-10-CM

## 2024-07-09 DIAGNOSIS — O368330 Maternal care for abnormalities of the fetal heart rate or rhythm, third trimester, not applicable or unspecified: Secondary | ICD-10-CM | POA: Insufficient documentation

## 2024-07-09 DIAGNOSIS — O26893 Other specified pregnancy related conditions, third trimester: Principal | ICD-10-CM

## 2024-07-09 DIAGNOSIS — Z3483 Encounter for supervision of other normal pregnancy, third trimester: Principal | ICD-10-CM

## 2024-07-09 LAB — WET PREP, GENITAL
Clue Cells Wet Prep HPF POC: NONE SEEN
Sperm: NONE SEEN
Trich, Wet Prep: NONE SEEN
WBC, Wet Prep HPF POC: <10 (ref <10)
Yeast Wet Prep HPF POC: NONE SEEN

## 2024-07-09 LAB — URINALYSIS, ROUTINE W REFLEX MICROSCOPIC
Bilirubin Urine: NEGATIVE
Glucose, UA: NEGATIVE mg/dL
Hgb urine dipstick: NEGATIVE
Ketones, ur: NEGATIVE mg/dL
Leukocytes,Ua: NEGATIVE
Nitrite: NEGATIVE
Protein, ur: NEGATIVE mg/dL
Specific Gravity, Urine: 1.008 (ref 1.005–1.030)
pH: 7 (ref 5.0–8.0)

## 2024-07-09 NOTE — OB Triage Note (Signed)
 LABOR & DELIVERY OB TRIAGE NOTE  SUBJECTIVE  HPI Betty Powell is a 33 y.o. H1E7947 at [redacted]w[redacted]d who presents to Labor & Delivery for pelvic pain rated as 2/10, exacerbated by walking, relieved with heat.   OB History     Gravida  8   Para  2   Term  2   Preterm  0   AB  5   Living  2      SAB  4   IAB  0   Ectopic  0   Multiple  0   Live Births  2          OBJECTIVE  BP 114/68 (BP Location: Left Arm)   Pulse 91   Temp 97.9 F (36.6 C)   Resp 18   Ht 5' 4 (1.626 m)   Wt 76.7 kg   LMP 10/15/2023 (Exact Date)   BMI 29.01 kg/m   General: alert and oriented Lungs: normal work of breathing Abdomen: soft, non-tender, gravid Cervical exam: deferred   NST I reviewed the NST and it was reactive.  Baseline: 125 Variability: moderate Accelerations: present Decelerations:none Toco: irritability Category 1  ASSESSMENT Impression  1) Pregnancy at H1E7947, [redacted]w[redacted]d, Estimated Date of Delivery: 08/08/24 2) Reassuring maternal/fetal status 3) Dehydration, resolved with PO hydration 4) Wet prep, UA negative  PLAN  Encouraged hydration, belly band use when ambulating. Discharged to home with return precautions.  Damien PARSLEY, CNM  07/09/24  4:29 PM

## 2024-07-09 NOTE — OB Triage Note (Signed)
 Pt reports to labor and delivery with complaints of pelvic pain - rates as 2/10, describes as sharp. Standing and walking exacerbates the pain - rates as 8/10. Has not taken any medications for the pain. Uses heated seats to help with the discomfort. Has been weeks since last intercourse.   Denies LOF and vaginal bleeding. States occasional contractions. Feels baby move like normal.   EFM and toco applied and assessing.

## 2024-07-09 NOTE — OB Triage Note (Signed)

## 2024-07-11 ENCOUNTER — Ambulatory Visit (INDEPENDENT_AMBULATORY_CARE_PROVIDER_SITE_OTHER): Admitting: Obstetrics

## 2024-07-11 ENCOUNTER — Encounter: Payer: Self-pay | Admitting: Obstetrics

## 2024-07-11 ENCOUNTER — Other Ambulatory Visit (HOSPITAL_COMMUNITY)
Admission: RE | Admit: 2024-07-11 | Discharge: 2024-07-11 | Disposition: A | Discharge status: Home / Self Care | Source: Ambulatory Visit | Attending: Obstetrics | Admitting: Obstetrics

## 2024-07-11 VITALS — BP 113/67 | HR 93 | Wt 171.0 lb

## 2024-07-11 DIAGNOSIS — Z113 Encounter for screening for infections with a predominantly sexual mode of transmission: Secondary | ICD-10-CM | POA: Diagnosis present

## 2024-07-11 DIAGNOSIS — Z3483 Encounter for supervision of other normal pregnancy, third trimester: Secondary | ICD-10-CM | POA: Diagnosis present

## 2024-07-11 DIAGNOSIS — Z3A36 36 weeks gestation of pregnancy: Secondary | ICD-10-CM

## 2024-07-11 DIAGNOSIS — Z3685 Encounter for antenatal screening for Streptococcus B: Secondary | ICD-10-CM

## 2024-07-11 NOTE — Assessment & Plan Note (Signed)
 Reviewed labor warning signs and expectations for birth. Instructed to call office or come to hospital with persistent headache, vision changes, regular contractions, leaking of fluid, decreased fetal movement or vaginal bleeding.

## 2024-07-11 NOTE — Progress Notes (Signed)
    Return Prenatal Note   Subjective   33 y.o. H1E7947 at [redacted]w[redacted]d presents for this follow-up prenatal visit.  Patient Betty Powell was seen in [facility] triage a few days ago with c/o q 3-7 min contractions. They spaced with rest and hydration. She is not having any regular contractions today. GBS and Gc/Ct sels-swabbed today.  Patient reports: Movement: Present Contractions: Irritability  Objective   Flow sheet Vitals: Pulse Rate: 93 BP: 113/67 Fundal Height: 36 cm Fetal Heart Rate (bpm): 130 Presentation: Vertex (on BSUS) Total weight gain: 56 lb (25.4 kg)  General Appearance  No acute distress, well appearing, and well nourished Pulmonary   Normal work of breathing Neurologic   Alert and oriented to person, place, and time Psychiatric   Mood and affect within normal limits   Assessment/Plan   Plan  33 y.o. H1E7947 at [redacted]w[redacted]d presents for follow-up OB visit. Reviewed prenatal record including previous visit note.  Supervision of normal pregnancy Reviewed labor warning signs and expectations for birth. Instructed to call office or come to hospital with persistent headache, vision changes, regular contractions, leaking of fluid, decreased fetal movement or vaginal bleeding.      Orders Placed This Encounter  Procedures   Strep Gp B NAA   No follow-ups on file.   Future Appointments  Date Time Provider Department Center  07/18/2024  1:55 PM Justino Eleanor HERO, CNM [facility] None  07/25/2024  9:55 AM Jayne Harlene CROME, CNM [facility] None  08/01/2024  9:35 AM Dominic, Jinnie Jansky, CNM [facility] None  08/07/2024  9:35 AM Jayne Harlene CROME, CNM [facility] None    For next visit:  continue with routine prenatal care     Charma DOMINO, CNM  09/05/253:33 PM

## 2024-07-13 LAB — STREP GP B NAA: Strep Gp B NAA: NEGATIVE

## 2024-07-14 LAB — CERVICOVAGINAL ANCILLARY ONLY
Chlamydia: NEGATIVE
Comment: NEGATIVE
Comment: NORMAL
Neisseria Gonorrhea: NEGATIVE

## 2024-07-18 ENCOUNTER — Ambulatory Visit: Admitting: Obstetrics

## 2024-07-18 ENCOUNTER — Encounter: Payer: Self-pay | Admitting: Obstetrics

## 2024-07-18 VITALS — BP 105/65 | HR 96 | Wt 172.8 lb

## 2024-07-18 DIAGNOSIS — Z3483 Encounter for supervision of other normal pregnancy, third trimester: Secondary | ICD-10-CM | POA: Diagnosis not present

## 2024-07-18 DIAGNOSIS — Z3A37 37 weeks gestation of pregnancy: Secondary | ICD-10-CM

## 2024-07-18 NOTE — Assessment & Plan Note (Signed)
 Reviewed labor warning signs and expectations for birth. Instructed to call office or come to hospital with persistent headache, vision changes, regular contractions, leaking of fluid, decreased fetal movement or vaginal bleeding.

## 2024-07-18 NOTE — Progress Notes (Signed)
    Return Prenatal Note   Subjective   33 y.o. H1E7947 at [redacted]w[redacted]d presents for this follow-up prenatal visit.  Patient reports pelvic pressure when walking around and with position changes. Denies LOF or vaginal bleeding. No rhythmic contractions. Patient reports: Movement: Present Contractions: Irregular  Objective   Flow sheet Vitals: Pulse Rate: 96 BP: 105/65 Fundal Height: 38 cm Fetal Heart Rate (bpm): 135 Presentation: Vertex Total weight gain: 57 lb 12.8 oz (26.2 kg)  General Appearance  No acute distress, well appearing, and well nourished Pulmonary   Normal work of breathing Neurologic   Alert and oriented to person, place, and time Psychiatric   Mood and affect within normal limits   Assessment/Plan   Plan  33 y.o. H1E7947 at [redacted]w[redacted]d presents for follow-up OB visit. Reviewed prenatal record including previous visit note.  Supervision of normal pregnancy Reviewed labor warning signs and expectations for birth. Instructed to call office or come to hospital with persistent headache, vision changes, regular contractions, leaking of fluid, decreased fetal movement or vaginal bleeding.       No orders of the defined types were placed in this encounter.  No follow-ups on file.   Future Appointments  Date Time Provider Department Center  07/25/2024  9:55 AM Jayne Harlene CROME, CNM [facility] None  08/01/2024  9:35 AM Dominic, Jinnie Jansky, CNM [facility] None  08/07/2024  9:35 AM Jayne Harlene CROME, CNM [facility] None    For next visit:  continue with routine prenatal care     Charma DOMINO, CNM  09/12/252:02 PM

## 2024-07-24 NOTE — Progress Notes (Unsigned)
    Return Prenatal Note   Subjective   33 y.o. H1E7947 at [redacted]w[redacted]d presents for this follow-up prenatal visit.  Patient reports ctx q 10-30 mins. No LOF. No bloody show. Has to pause (stop talking) when a contraction comes.  Patient reports: Movement: Present Contractions: Regular  Objective   Flow sheet Vitals: Pulse Rate: 93 BP: 120/75 Fetal Heart Rate (bpm): 150 Presentation: Vertex Dilation: 1 Effacement (%): 0 Station: -3 Total weight gain: 59 lb 14.4 oz (27.2 kg)  General Appearance  No acute distress, well appearing, and well nourished Pulmonary   Normal work of breathing Neurologic   Alert and oriented to person, place, and time Psychiatric   Mood and affect within normal limits   Assessment/Plan   Plan  33 y.o. H1E7947 at [redacted]w[redacted]d presents for follow-up OB visit. Reviewed prenatal record including previous visit note.  Supervision of normal pregnancy Reviewed labor warning signs and expectations for birth. Instructed to call office or come to hospital with persistent headache, vision changes, regular contractions (q 5-7 mins), leaking of fluid, decreased fetal movement or vaginal bleeding.       No orders of the defined types were placed in this encounter.  No follow-ups on file.   Future Appointments  Date Time Provider Department Center  08/01/2024  9:35 AM Dominic, Jinnie Jansky, CNM [facility] None  08/07/2024  9:35 AM Jayne Harlene CROME, CNM [facility] None    For next visit:  continue with routine prenatal care     Charma DOMINO, CNM  09/19/252:49 PM

## 2024-07-25 ENCOUNTER — Ambulatory Visit (INDEPENDENT_AMBULATORY_CARE_PROVIDER_SITE_OTHER): Admitting: Certified Nurse Midwife

## 2024-07-25 VITALS — BP 120/75 | HR 93 | Wt 174.9 lb

## 2024-07-25 DIAGNOSIS — Z3483 Encounter for supervision of other normal pregnancy, third trimester: Secondary | ICD-10-CM | POA: Diagnosis not present

## 2024-07-25 DIAGNOSIS — Z3A38 38 weeks gestation of pregnancy: Secondary | ICD-10-CM

## 2024-07-25 NOTE — Assessment & Plan Note (Signed)
 Reviewed labor warning signs and expectations for birth. Instructed to call office or come to hospital with persistent headache, vision changes, regular contractions (q 5-7 mins), leaking of fluid, decreased fetal movement or vaginal bleeding.

## 2024-07-25 NOTE — Patient Instructions (Signed)
 Signs and Symptoms of Labor Labor is the body's natural process of moving the baby and the placenta out of the uterus. The process of labor usually starts when the baby is full-term, between 74 and 41 weeks of pregnancy. Signs and symptoms that you are close to going into labor As your body prepares for labor and the birth of your baby, you may notice the following symptoms in the weeks and days before true labor starts: Passing a small amount of thick, bloody mucus from your vagina. This is called normal bloody show or losing your mucus plug. This may happen more than a week before labor begins, or right before labor begins, as the opening of the cervix starts to widen (dilate). For some women, the entire mucus plug passes at once. For others, pieces of the mucus plug may gradually pass over several days. Your baby moving (dropping) lower in your pelvis to get into position for birth (lightening). When this happens, you may feel more pressure on your bladder and pelvic bone and less pressure on your ribs. This may make it easier to breathe. It may also cause you to need to urinate more often and have problems with bowel movements. Having "practice contractions," also called Braxton Hicks contractions or false labor. These occur at irregular (unevenly spaced) intervals that are more than 10 minutes apart. False labor contractions are common after exercise or sexual activity. They will stop if you change position, rest, or drink fluids. These contractions are usually mild and do not get stronger over time. They may feel like: A backache or back pain. Mild cramps, similar to menstrual cramps. Tightening or pressure in your abdomen. Other early symptoms include: Nausea or loss of appetite. Diarrhea. Having a sudden burst of energy, or feeling very tired. Mood changes. Having trouble sleeping. Signs and symptoms that labor has begun Signs that you are in labor may include: Having contractions that come  at regular (evenly spaced) intervals and increase in intensity. This may feel like more intense tightening or pressure in your abdomen that moves to your back. Contractions may also feel like rhythmic pain in your upper thighs or back that comes and goes at regular intervals. If you are delivering for the first time, this change in intensity of contractions often occurs at a more gradual pace. If you have given birth before, you may notice a more rapid progression of contraction changes. Feeling pressure in the vaginal area. Your water breaking (rupture of membranes). This is when the sac of fluid that surrounds your baby breaks. Fluid leaking from your vagina may be clear or blood-tinged. Labor usually starts within 24 hours of your water breaking, but it may take longer to begin. Some people may feel a sudden gush of fluid; others may notice repeatedly damp underwear. Follow these instructions at home:  When labor starts, or if your water breaks, call your health care provider or nurse care line. Based on your situation, they will determine when you should go in for an exam. During early labor, you may be able to rest and manage symptoms at home. Some strategies to try at home include: Breathing and relaxation techniques. Taking a warm bath or shower. Listening to music. Using a heating pad on the lower back for pain. If directed, apply heat to the area as often as told by your health care provider. Use the heat source that your health care provider recommends, such as a moist heat pack or a heating pad. Place a  towel between your skin and the heat source. Leave the heat on for 20-30 minutes. Remove the heat if your skin turns bright red. This is especially important if you are unable to feel pain, heat, or cold. You have a greater risk of getting burned. Contact a health care provider if: Your labor has started. Your water breaks. You have nausea, vomiting, or diarrhea. Get help right away  if: You have painful, regular contractions that are 5 minutes apart or less. Labor starts before you are [redacted] weeks along in your pregnancy. You have a fever. You have bright red blood coming from your vagina. You do not feel your baby moving. You have a severe headache with or without vision problems. You have chest pain or shortness of breath. These symptoms may represent a serious problem that is an emergency. Do not wait to see if the symptoms will go away. Get medical help right away. Call your local emergency services (911 in the [place] Do not drive yourself to the hospital. Summary Labor is your body's natural process of moving your baby and the placenta out of your uterus. The process of labor usually starts when your baby is full-term, between 25 and 40 weeks of pregnancy. When labor starts, or if your water breaks, call your health care provider or nurse care line. Based on your situation, they will determine when you should go in for an exam. This information is not intended to replace advice given to you by your health care provider. Make sure you discuss any questions you have with your health care provider. Document Revised: 03/08/2021 Document Reviewed: 03/08/2021 Elsevier Patient Education  2024 [employer].

## 2024-07-27 ENCOUNTER — Observation Stay
Admission: EM | Admit: 2024-07-27 | Discharge: 2024-07-27 | Disposition: A | Discharge status: Home / Self Care | Attending: Registered Nurse | Admitting: Registered Nurse

## 2024-07-27 ENCOUNTER — Encounter: Payer: Self-pay | Admitting: Obstetrics and Gynecology

## 2024-07-27 DIAGNOSIS — O368130 Decreased fetal movements, third trimester, not applicable or unspecified: Principal | ICD-10-CM

## 2024-07-27 DIAGNOSIS — Z3A38 38 weeks gestation of pregnancy: Secondary | ICD-10-CM | POA: Diagnosis not present

## 2024-07-27 DIAGNOSIS — O368190 Decreased fetal movements, unspecified trimester, not applicable or unspecified: Secondary | ICD-10-CM | POA: Diagnosis present

## 2024-07-27 NOTE — OB Triage Note (Signed)
 G8P2 at 38.2 week reporting DFM since last night. Denies any LOF or VB. Pt is tearful and relieved when FHR is on the monitor. Movement noted after monitors applied and pt reassured.

## 2024-07-27 NOTE — OB Triage Note (Signed)
 LABOR & DELIVERY OB TRIAGE NOTE  SUBJECTIVE  HPI Betty Powell is a 33 y.o. H1E7947 at [redacted]w[redacted]d who presents to Labor & Delivery for evaluation of decreased fetal movement. Baby is usually very active in the morning, and she did not notice much, if any movement overnight or this morning. Thinks she may also be having contractions.   OB History     Gravida  8   Para  2   Term  2   Preterm  0   AB  5   Living  2      SAB  4   IAB  0   Ectopic  0   Multiple  0   Live Births  2          OBJECTIVE  BP 102/60   Pulse 88   Temp 98.2 F (36.8 C) (Oral)   Resp 20   LMP 10/15/2023 (Exact Date)   General: Alert and oriented Abdomen: gravid, soft, palpable and visible fetal movement Cervical exam: deferred  NST I reviewed the NST and it was reactive.  Baseline: 135 Variability: moderate Accelerations: present Decelerations:none Toco: Irreg Category 1 tracing  No results found for this or any previous visit (from the past 24 hours).  No results found.  ASSESSMENT Impression  1) Pregnancy at H1E7947, [redacted]w[redacted]d, Estimated Date of Delivery: 08/08/24 2) Reassuring maternal/fetal status. Now feeling fetal movement. Cat 1 tracing. 3) Declines SVE.  4) Reviewed [facility], s/s labor.   PLAN: D/c home.  Charma DOMINO, CNM 07/27/24  8:44 AM

## 2024-07-30 NOTE — Progress Notes (Signed)
    Return Prenatal Note   Subjective   33 y.o. H1E7947 at [redacted]w[redacted]d presents for this follow-up prenatal visit.  Patient  Patient reports: Feeling a lot of pelvic pressure. This is Michael's first baby, she would like the birth to be a special event for the two of them, she does not plan to have other visitors present.   Movement: Present Contractions: Irritability  Objective   Flow sheet Vitals: Pulse Rate: (!) 106 BP: 121/80 Fundal Height: 37 cm Fetal Heart Rate (bpm): 135 Presentation: Vertex Dilation: 1 Effacement (%): 50 Station: -2 Total weight gain: 57 lb 3.2 oz (25.9 kg)  General Appearance  No acute distress, well appearing, and well nourished Pulmonary   Normal work of breathing Neurologic   Alert and oriented to person, place, and time Psychiatric   Mood and affect within normal limits   Assessment/Plan   Plan  33 y.o. H1E7947 at [redacted]w[redacted]d presents for follow-up OB visit. Reviewed prenatal record including previous visit note.  Supervision of normal pregnancy -Her mother or friend will watch her children while in labor -Her fiance will be her labor support, has a birth plan, delayed cord clamping until cord turns white  -Schedule IOL at next visit -warning signs reviewed       No orders of the defined types were placed in this encounter.  Return in about 1 week (around 08/08/2024) for ROB, NST.   Future Appointments  Date Time Provider Department Center  08/07/2024  9:15 AM AOB-NST ROOM [facility] None  08/07/2024  9:35 AM Jayne Harlene CROME, CNM [facility] None    For next visit:  ROB with NST     Larisa Lanius [facility], CNM  09/27/259:30 AM

## 2024-07-31 NOTE — Patient Instructions (Signed)
 Signs and Symptoms of Labor Labor is the body's natural process of moving the baby and the placenta out of the uterus. The process of labor usually starts when the baby is full-term, between 74 and 41 weeks of pregnancy. Signs and symptoms that you are close to going into labor As your body prepares for labor and the birth of your baby, you may notice the following symptoms in the weeks and days before true labor starts: Passing a small amount of thick, bloody mucus from your vagina. This is called normal bloody show or losing your mucus plug. This may happen more than a week before labor begins, or right before labor begins, as the opening of the cervix starts to widen (dilate). For some women, the entire mucus plug passes at once. For others, pieces of the mucus plug may gradually pass over several days. Your baby moving (dropping) lower in your pelvis to get into position for birth (lightening). When this happens, you may feel more pressure on your bladder and pelvic bone and less pressure on your ribs. This may make it easier to breathe. It may also cause you to need to urinate more often and have problems with bowel movements. Having "practice contractions," also called Braxton Hicks contractions or false labor. These occur at irregular (unevenly spaced) intervals that are more than 10 minutes apart. False labor contractions are common after exercise or sexual activity. They will stop if you change position, rest, or drink fluids. These contractions are usually mild and do not get stronger over time. They may feel like: A backache or back pain. Mild cramps, similar to menstrual cramps. Tightening or pressure in your abdomen. Other early symptoms include: Nausea or loss of appetite. Diarrhea. Having a sudden burst of energy, or feeling very tired. Mood changes. Having trouble sleeping. Signs and symptoms that labor has begun Signs that you are in labor may include: Having contractions that come  at regular (evenly spaced) intervals and increase in intensity. This may feel like more intense tightening or pressure in your abdomen that moves to your back. Contractions may also feel like rhythmic pain in your upper thighs or back that comes and goes at regular intervals. If you are delivering for the first time, this change in intensity of contractions often occurs at a more gradual pace. If you have given birth before, you may notice a more rapid progression of contraction changes. Feeling pressure in the vaginal area. Your water breaking (rupture of membranes). This is when the sac of fluid that surrounds your baby breaks. Fluid leaking from your vagina may be clear or blood-tinged. Labor usually starts within 24 hours of your water breaking, but it may take longer to begin. Some people may feel a sudden gush of fluid; others may notice repeatedly damp underwear. Follow these instructions at home:  When labor starts, or if your water breaks, call your health care provider or nurse care line. Based on your situation, they will determine when you should go in for an exam. During early labor, you may be able to rest and manage symptoms at home. Some strategies to try at home include: Breathing and relaxation techniques. Taking a warm bath or shower. Listening to music. Using a heating pad on the lower back for pain. If directed, apply heat to the area as often as told by your health care provider. Use the heat source that your health care provider recommends, such as a moist heat pack or a heating pad. Place a  towel between your skin and the heat source. Leave the heat on for 20-30 minutes. Remove the heat if your skin turns bright red. This is especially important if you are unable to feel pain, heat, or cold. You have a greater risk of getting burned. Contact a health care provider if: Your labor has started. Your water breaks. You have nausea, vomiting, or diarrhea. Get help right away  if: You have painful, regular contractions that are 5 minutes apart or less. Labor starts before you are [redacted] weeks along in your pregnancy. You have a fever. You have bright red blood coming from your vagina. You do not feel your baby moving. You have a severe headache with or without vision problems. You have chest pain or shortness of breath. These symptoms may represent a serious problem that is an emergency. Do not wait to see if the symptoms will go away. Get medical help right away. Call your local emergency services (911 in the [place] Do not drive yourself to the hospital. Summary Labor is your body's natural process of moving your baby and the placenta out of your uterus. The process of labor usually starts when your baby is full-term, between 25 and 40 weeks of pregnancy. When labor starts, or if your water breaks, call your health care provider or nurse care line. Based on your situation, they will determine when you should go in for an exam. This information is not intended to replace advice given to you by your health care provider. Make sure you discuss any questions you have with your health care provider. Document Revised: 03/08/2021 Document Reviewed: 03/08/2021 Elsevier Patient Education  2024 [employer].

## 2024-08-01 ENCOUNTER — Ambulatory Visit (INDEPENDENT_AMBULATORY_CARE_PROVIDER_SITE_OTHER): Admitting: Licensed Practical Nurse

## 2024-08-01 ENCOUNTER — Encounter: Payer: Self-pay | Admitting: Licensed Practical Nurse

## 2024-08-01 VITALS — BP 121/80 | HR 106 | Wt 172.2 lb

## 2024-08-01 DIAGNOSIS — Z3A39 39 weeks gestation of pregnancy: Secondary | ICD-10-CM | POA: Diagnosis not present

## 2024-08-01 DIAGNOSIS — Z3483 Encounter for supervision of other normal pregnancy, third trimester: Secondary | ICD-10-CM

## 2024-08-01 NOTE — Assessment & Plan Note (Signed)
-  Her mother or friend will watch her children while in labor -Her fiance will be her labor support, has a birth plan, delayed cord clamping until cord turns white  -Schedule IOL at next visit -warning signs reviewed

## 2024-08-05 ENCOUNTER — Inpatient Hospital Stay
Admission: EM | Admit: 2024-08-05 | Discharge: 2024-08-07 | DRG: 807 | Disposition: A | Discharge status: Home / Self Care | Attending: Obstetrics | Admitting: Obstetrics

## 2024-08-05 ENCOUNTER — Other Ambulatory Visit: Payer: Self-pay

## 2024-08-05 ENCOUNTER — Encounter: Payer: Self-pay | Admitting: Certified Nurse Midwife

## 2024-08-05 DIAGNOSIS — O99334 Smoking (tobacco) complicating childbirth: Secondary | ICD-10-CM | POA: Diagnosis present

## 2024-08-05 DIAGNOSIS — Z833 Family history of diabetes mellitus: Secondary | ICD-10-CM | POA: Diagnosis not present

## 2024-08-05 DIAGNOSIS — O26893 Other specified pregnancy related conditions, third trimester: Secondary | ICD-10-CM | POA: Diagnosis present

## 2024-08-05 DIAGNOSIS — Z141 Cystic fibrosis carrier: Secondary | ICD-10-CM | POA: Diagnosis not present

## 2024-08-05 DIAGNOSIS — Z3A39 39 weeks gestation of pregnancy: Secondary | ICD-10-CM

## 2024-08-05 DIAGNOSIS — O368330 Maternal care for abnormalities of the fetal heart rate or rhythm, third trimester, not applicable or unspecified: Secondary | ICD-10-CM | POA: Diagnosis not present

## 2024-08-05 DIAGNOSIS — O9902 Anemia complicating childbirth: Secondary | ICD-10-CM | POA: Diagnosis present

## 2024-08-05 DIAGNOSIS — Z8249 Family history of ischemic heart disease and other diseases of the circulatory system: Secondary | ICD-10-CM | POA: Diagnosis not present

## 2024-08-05 DIAGNOSIS — F17210 Nicotine dependence, cigarettes, uncomplicated: Secondary | ICD-10-CM | POA: Diagnosis present

## 2024-08-05 DIAGNOSIS — O288 Other abnormal findings on antenatal screening of mother: Secondary | ICD-10-CM | POA: Diagnosis present

## 2024-08-05 DIAGNOSIS — O479 False labor, unspecified: Secondary | ICD-10-CM | POA: Diagnosis present

## 2024-08-05 DIAGNOSIS — Z3483 Encounter for supervision of other normal pregnancy, third trimester: Principal | ICD-10-CM

## 2024-08-05 LAB — CBC
HCT: 31.2 % — ABNORMAL LOW (ref 36.0–46.0)
Hemoglobin: 11 g/dL — ABNORMAL LOW (ref 12.0–15.0)
MCH: 35.5 pg — ABNORMAL HIGH (ref 26.0–34.0)
MCHC: 35.3 g/dL (ref 30.0–36.0)
MCV: 100.6 fL — ABNORMAL HIGH (ref 80.0–100.0)
Platelets: 222 K/uL (ref 150–400)
RBC: 3.1 MIL/uL — ABNORMAL LOW (ref 3.87–5.11)
RDW: 12.6 % (ref 11.5–15.5)
WBC: 10.8 K/uL — ABNORMAL HIGH (ref 4.0–10.5)
nRBC: 0 % (ref 0.0–0.2)

## 2024-08-05 LAB — TYPE AND SCREEN
ABO/RH(D): O POS
Antibody Screen: NEGATIVE

## 2024-08-05 MED ORDER — OXYTOCIN-SODIUM CHLORIDE 30-0.9 UT/500ML-% IV SOLN: 1.0000 m[IU]/min | INTRAVENOUS | Status: DC

## 2024-08-05 MED ORDER — ONDANSETRON HCL 4 MG/2ML IJ SOLN: 4.0000 mg | Freq: Four times a day (QID) | INTRAMUSCULAR | Status: DC | PRN

## 2024-08-05 MED ORDER — HYDROXYZINE HCL 25 MG PO TABS
50.0000 mg | ORAL_TABLET | Freq: Four times a day (QID) | ORAL | Status: DC | PRN
  Administered 2024-08-06: 50 mg via ORAL
  Filled 2024-08-05: qty 2

## 2024-08-05 MED ORDER — OXYTOCIN 10 UNIT/ML IJ SOLN
INTRAMUSCULAR | Status: AC
  Filled 2024-08-05: qty 2

## 2024-08-05 MED ORDER — ACETAMINOPHEN 500 MG PO TABS
1000.0000 mg | ORAL_TABLET | Freq: Four times a day (QID) | ORAL | Status: DC | PRN
  Administered 2024-08-06: 1000 mg via ORAL
  Filled 2024-08-05: qty 2

## 2024-08-05 MED ORDER — MISOPROSTOL 200 MCG PO TABS
ORAL_TABLET | ORAL | Status: AC
  Filled 2024-08-05: qty 4

## 2024-08-05 MED ORDER — TERBUTALINE SULFATE 1 MG/ML IJ SOLN: 0.2500 mg | Freq: Once | INTRAMUSCULAR | Status: DC | PRN

## 2024-08-05 MED ORDER — SOD CITRATE-CITRIC ACID 500-334 MG/5ML PO SOLN: 30.0000 mL | ORAL | Status: DC | PRN

## 2024-08-05 MED ORDER — OXYTOCIN BOLUS FROM INFUSION
333.0000 mL | Freq: Once | INTRAVENOUS | Status: AC
  Administered 2024-08-06: 333 mL via INTRAVENOUS

## 2024-08-05 MED ORDER — LIDOCAINE HCL (PF) 1 % IJ SOLN: 30.0000 mL | INTRAMUSCULAR | Status: DC | PRN

## 2024-08-05 MED ORDER — OXYTOCIN-SODIUM CHLORIDE 30-0.9 UT/500ML-% IV SOLN: 2.5000 [IU]/h | INTRAVENOUS | Status: DC

## 2024-08-05 MED ORDER — AMMONIA AROMATIC IN INHA
RESPIRATORY_TRACT | Status: AC
  Filled 2024-08-05: qty 10

## 2024-08-05 MED ORDER — LACTATED RINGERS IV SOLN
500.0000 mL | INTRAVENOUS | Status: DC | PRN
  Administered 2024-08-05: 1000 mL via INTRAVENOUS

## 2024-08-05 MED ORDER — LACTATED RINGERS IV SOLN: 500.0000 mL | INTRAVENOUS | Status: DC | PRN

## 2024-08-05 MED ORDER — LIDOCAINE HCL (PF) 1 % IJ SOLN
INTRAMUSCULAR | Status: AC
  Filled 2024-08-05: qty 30

## 2024-08-05 MED ORDER — OXYTOCIN-SODIUM CHLORIDE 30-0.9 UT/500ML-% IV SOLN
INTRAVENOUS | Status: AC
  Administered 2024-08-06: 2 m[IU]/min via INTRAVENOUS
  Filled 2024-08-05: qty 500

## 2024-08-05 MED ORDER — LACTATED RINGERS IV SOLN: INTRAVENOUS | Status: DC

## 2024-08-05 MED ORDER — ACETAMINOPHEN 500 MG PO TABS: 1000.0000 mg | ORAL_TABLET | Freq: Four times a day (QID) | ORAL | Status: DC | PRN

## 2024-08-05 NOTE — Progress Notes (Deleted)
    Return Prenatal Note   Subjective   33 y.o. H1E7947 at [redacted]w[redacted]d presents for this follow-up prenatal visit.  Patient *** Patient reports:    Objective   Flow sheet Vitals:   Total weight gain: 57 lb 3.2 oz (25.9 kg)  General Appearance  No acute distress, well appearing, and well nourished Pulmonary   Normal work of breathing Neurologic   Alert and oriented to person, place, and time Psychiatric   Mood and affect within normal limits   Assessment/Plan   Plan  33 y.o. H1E7947 at [redacted]w[redacted]d presents for follow-up OB visit. Reviewed prenatal record including previous visit note.  No problem-specific Assessment & Plan notes found for this encounter.      No orders of the defined types were placed in this encounter.  No follow-ups on file.   Future Appointments  Date Time Provider Department Center  08/07/2024  9:15 AM AOB-NST ROOM [facility] None  08/07/2024  9:35 AM Jayne Harlene CROME, CNM [facility] None    For next visit:  {[email or web address]}     Makara Lanzo L Xavier Fournier, CMA  09/30/259:27 AM

## 2024-08-05 NOTE — H&P (Signed)
 [facility] Labor & Delivery  History and Physical   HPI   Chief Complaint: contractions  Betty Powell is a 33 y.o. [phone] at [redacted]w[redacted]d who presents for uterine contractions, she has been contracting off & on for the last week, today they increased in intensity & frequency at 230pm. They are coming every 61m, feeling more intense & breathing through. Endorses fetal movement, denies loss of fluid or vaginal bleeding.   Pregnancy complicated by history of substance use disorder, in remission & off suboxone  since 2019. 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   HISTORY   Medications Medications Prior to Admission  Medication Sig Dispense Refill Last Dose/Taking   ferrous sulfate  325 (65 FE) MG tablet Take 1 tablet (325 mg total) by mouth daily. 90 tablet 1 Past Week   Prenatal Vit-Fe Fumarate-FA (MULTIVITAMIN-PRENATAL) 27-0.8 MG TABS tablet Take 1 tablet by mouth daily at 12 noon.   08/05/2024   polyethylene glycol (MIRALAX ) 17 g packet Take 17 g by mouth daily. 90 packet 1     Allergies is allergic to nicoderm [nicotine ] and tape.   OB History OB History  Gravida Para Term Preterm AB Living  8 2 2  0 5 2  SAB IAB Ectopic Multiple Live Births  4 0 0 0 2    # Outcome Date GA Lbr Len/2nd Weight Sex Type Anes PTL Lv  8 Current           7 SAB 08/2023 [redacted]w[redacted]d    SAB     6 AB 09/17/19 [redacted]w[redacted]d         5 Term 02/17/17 [redacted]w[redacted]d 03:50 / 00:06 3630 g M Vag-Spont None  LIV     Birth Comments: no anomalies  observed at delivery     Name: Nanez,BOY Mardi     Apgar1: 8  Apgar5: 9  4 Term 01/08/12 [redacted]w[redacted]d  2920 g M Vag-Spont  N LIV  3 SAB 2012     SAB     2 SAB 2012     SAB     1 SAB 2011     SAB       Past Medical History Past Medical History:  Diagnosis Date   Anemia    Anxiety    B12 deficiency    BV (bacterial vaginosis)    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   Endometriosis    Epilepsy (HCC)    History of spontaneous abortion    x3   Seizures (HCC)    no seizures since high school   Trichomoniasis     Past Surgical History Past Surgical History:  Procedure Laterality Date   BREAST BIOPSY Right 02/14/2023   US  Bx, Venus Clip, path pending   BREAST BIOPSY Right 02/14/2023   US  RT BREAST BX W LOC DEV 1ST LESION IMG BX SPEC US  GUIDE 02/14/2023 [facility]   DILATION AND EVACUATION N/A 08/24/2023   Procedure: Suction D&C;  Surgeon: Connell Davies, MD;  Location: [facility] ORS;  Service: Gynecology;  Laterality: N/A;   elective abortion  2021   WISDOM TOOTH EXTRACTION      Social History  reports that she has been smoking cigarettes. She has been exposed to tobacco smoke. She has never used smokeless tobacco. She reports that she does not currently use alcohol. She reports that she does not currently use drugs after having used the following drugs: Marijuana and Other-see comments.   Family History family history includes Asthma in her paternal grandmother; Breast cancer (age of onset: 64) in her paternal great-grandmother; Cancer (age of onset: 62) in her maternal grandfather; Cervical cancer in her maternal grandmother; Diabetes in her father, maternal grandfather, and mother; Heart attack in her maternal grandmother; Heart disease in her father; Hyperlipidemia in her mother; Hypertension in her brother, maternal grandfather, and mother; Lung cancer (age of onset: 27) in her mother.   PHYSICAL EXAM   Vitals:   08/05/24 1718  BP: (!) 102/57  Pulse: 81  Resp: 16   Temp: 97.9 F (36.6 C)  TempSrc: Oral  Weight: 78 kg  Height: 5' 4 (1.626 m)    Constitutional: No acute distress, well appearing, and well nourished. Neurologic: She is alert and conversational.  Psychiatric: She has a normal mood and affect.  Musculoskeletal: Normal gait, grossly normal range of motion Cardiovascular: Normal rate.   Pulmonary/Chest: Normal work of breathing.  Gastrointestinal/Abdominal: Soft. Gravid. There is no tenderness.  Skin: Skin is warm and dry. No rash noted.  Genitourinary: Normal external female genitalia.  SVE: Dilation: 1.5 Effacement (%): 60 Cervical Position: Middle Station: -2 Presentation: Vertex Exam by:: Seren Chaloux,J CNM   NST Interpretation Indication: contractions  Baseline: 135 bpm Variability: moderate Accelerations: present Decelerations: intermittent late decels present Contractions: regular, every 3-5 minutes Time noted:  See OBIX Impression: equivocal Authenticated by: Harlene LITTIE Cisco    PRENATAL LABS FROM OB RESULTS CONSOLE  ABO, Rh: --/--/O POS (09/30 1757) Antibody: NEG (09/30 1757) Rubella: 2.02 (03/17 1029) Varicella: Reactive (03/17 1029) RPR: Non Reactive (07/03 0922)  HBsAg: Negative (03/17 1029)  HC Non Reactive (03/17 1029) HIV: Non Reactive (07/03 0922)  GC:  Lab Results  Component Value Date   NGONORRHOEAE NOT DETECTED 04/25/2024   CT:  Lab Results  Component Value Date   CHLAMYDIA NOT DETECTED 04/25/2024   1h GTT:  Lab Results  Component Value Date   GLUCOSE 132 (H) 02/28/2018   3h GTT:   GBS: Negative/-- (09/05 1159)  ENCOUNTER LABS   Results for orders placed or performed during the hospital encounter of 08/05/24 (from the past 24 hours)  Type and screen [facility]     Status: None   Collection Time: 08/05/24  5:57 PM  Result Value Ref Range   ABO/RH(D) O POS    Antibody Screen NEG    Sample Expiration      08/08/2024,2359 Performed at [facility],  [address] [place], [place] [address]   CBC     Status: Abnormal   Collection Time: 08/05/24  5:57 PM  Result Value Ref Range   WBC 10.8 (H) 4.0 - 10.5 K/uL   RBC 3.10 (L) 3.87 - 5.11 MIL/uL   Hemoglobin 11.0 (L) 12.0 - 15.0 g/dL   HCT 68.7 (L) 63.9 - 53.9 %   MCV 100.6 (H) 80.0 - 100.0 fL   MCH 35.5 (H) 26.0 - 34.0 pg   MCHC 35.3 30.0 - 36.0 g/dL   RDW 87.3 88.4 - 84.4 %   Platelets 222 150 - 400 K/uL   nRBC 0.0 0.0 -  0.2 %    ASSESSMENT AND PLAN   Betty Powell is a 33 y.o. [id number] at [redacted]w[redacted]d with EDD: 08/08/2024, by Ultrasound admitted for induction of labor due to Non-reactive NST. Discussed with Blakleigh & partner concern for intermittent lates despite IVF bolus and recommendation for IOL given term with favorable cervix. Mashanda in agreement, all questions answered. Will place [facility] catheter for ripening, pitocin  when indicated.   Fetal Status: - cephalic presentation by Leopold's maneuvers and SVE, BSUS in clinic 9/5 - EFW: 7.5lb by Leopold's - CEFM - FHR currently category 2  GBS: negative Not indicated  Pain management: - plans labor support without medication, avoid IV narcotics d/t hx SUD - aware of options for pain management other than IV narcotics  CF carrier - partner tested & negative    Labs/Immunizations: TDAP: Given prenatally. Flu: Declined RSV: No Rubella: Immune Varicella: Immune   Postpartum Plan: - Feeding: Breast Milk - Contraception: plans NFP - Prenatal Care Provider: [facility]  Attending Dr. Fredirick was immediately available for the care of the patient.

## 2024-08-05 NOTE — OB Triage Note (Signed)
 Patient is a 33 yo, G8P2, at 39 weeks 4 days. Patient presents with complaints of contractions. Patient states the contractions started around 1430 today and are approximately 5 mins apart. She reports she feels the contractions in her lower back and rates the pain 6/10 on the pain scale. Patient denies any vaginal bleeding or LOF. Patient reports +FM. Monitors applied and assessing. VSS. Initial fetal heart tone . Jayne CNM notified of patients arrival to unit. Plan to place in observation for NST and labor eval. Initial SVE 1/60/-2.

## 2024-08-06 ENCOUNTER — Inpatient Hospital Stay: Admitting: Anesthesiology

## 2024-08-06 DIAGNOSIS — O368330 Maternal care for abnormalities of the fetal heart rate or rhythm, third trimester, not applicable or unspecified: Secondary | ICD-10-CM

## 2024-08-06 DIAGNOSIS — Z3A39 39 weeks gestation of pregnancy: Secondary | ICD-10-CM

## 2024-08-06 LAB — RPR: RPR Ser Ql: NONREACTIVE

## 2024-08-06 MED ORDER — DOCUSATE SODIUM 100 MG PO CAPS
100.0000 mg | ORAL_CAPSULE | Freq: Two times a day (BID) | ORAL | Status: DC
Start: 2024-08-06 — End: 2024-08-07
  Administered 2024-08-06 – 2024-08-07 (×2): 100 mg via ORAL
  Filled 2024-08-06 (×3): qty 1

## 2024-08-06 MED ORDER — CALCIUM CARBONATE ANTACID 500 MG PO CHEW: 2.0000 | CHEWABLE_TABLET | ORAL | Status: DC | PRN

## 2024-08-06 MED ORDER — EPHEDRINE 5 MG/ML INJ: 10.0000 mg | INTRAVENOUS | Status: DC | PRN

## 2024-08-06 MED ORDER — WITCH HAZEL-GLYCERIN EX PADS
MEDICATED_PAD | CUTANEOUS | Status: DC | PRN
  Filled 2024-08-06: qty 100

## 2024-08-06 MED ORDER — ACETAMINOPHEN 325 MG PO TABS
650.0000 mg | ORAL_TABLET | ORAL | Status: DC | PRN
  Administered 2024-08-06 – 2024-08-07 (×3): 650 mg via ORAL
  Filled 2024-08-06 (×3): qty 2

## 2024-08-06 MED ORDER — OXYTOCIN-SODIUM CHLORIDE 30-0.9 UT/500ML-% IV SOLN
2.5000 [IU]/h | INTRAVENOUS | Status: DC | PRN
  Administered 2024-08-06: 2.5 [IU]/h via INTRAVENOUS

## 2024-08-06 MED ORDER — DIPHENHYDRAMINE HCL 25 MG PO CAPS: 25.0000 mg | ORAL_CAPSULE | Freq: Four times a day (QID) | ORAL | Status: DC | PRN

## 2024-08-06 MED ORDER — SIMETHICONE 80 MG PO CHEW: 80.0000 mg | CHEWABLE_TABLET | ORAL | Status: DC | PRN

## 2024-08-06 MED ORDER — LIDOCAINE HCL (PF) 1 % IJ SOLN
INTRAMUSCULAR | Status: DC | PRN
  Administered 2024-08-06: 3 mL

## 2024-08-06 MED ORDER — PHENYLEPHRINE 80 MCG/ML (10ML) SYRINGE FOR IV PUSH (FOR BLOOD PRESSURE SUPPORT): 80.0000 ug | PREFILLED_SYRINGE | INTRAVENOUS | Status: DC | PRN

## 2024-08-06 MED ORDER — BUPIVACAINE HCL (PF) 0.25 % IJ SOLN
INTRAMUSCULAR | Status: DC | PRN
  Administered 2024-08-06: 3 mL via EPIDURAL
  Administered 2024-08-06: 4 mL via EPIDURAL

## 2024-08-06 MED ORDER — PRENATAL MULTIVITAMIN CH
1.0000 | ORAL_TABLET | Freq: Every day | ORAL | Status: DC
  Administered 2024-08-07: 1 via ORAL
  Filled 2024-08-06: qty 1

## 2024-08-06 MED ORDER — OXYCODONE-ACETAMINOPHEN 5-325 MG PO TABS: 1.0000 | ORAL_TABLET | ORAL | Status: DC | PRN

## 2024-08-06 MED ORDER — LIDOCAINE-EPINEPHRINE (PF) 1.5 %-1:200000 IJ SOLN
INTRAMUSCULAR | Status: DC | PRN
  Administered 2024-08-06: 3 mL via PERINEURAL

## 2024-08-06 MED ORDER — FENTANYL-BUPIVACAINE-NACL 0.5-0.125-0.9 MG/250ML-% EP SOLN
12.0000 mL/h | EPIDURAL | Status: DC | PRN
  Administered 2024-08-06: 12 mL/h via EPIDURAL
  Filled 2024-08-06: qty 250

## 2024-08-06 MED ORDER — IBUPROFEN 600 MG PO TABS
600.0000 mg | ORAL_TABLET | Freq: Four times a day (QID) | ORAL | Status: DC
  Administered 2024-08-06 – 2024-08-07 (×4): 600 mg via ORAL
  Filled 2024-08-06 (×5): qty 1

## 2024-08-06 MED ORDER — LACTATED RINGERS IV SOLN
500.0000 mL | Freq: Once | INTRAVENOUS | Status: AC
  Administered 2024-08-06: 500 mL via INTRAVENOUS

## 2024-08-06 MED ORDER — DIPHENHYDRAMINE HCL 50 MG/ML IJ SOLN: 12.5000 mg | INTRAMUSCULAR | Status: DC | PRN

## 2024-08-06 MED ORDER — BENZOCAINE-MENTHOL 20-0.5 % EX AERO
1.0000 | INHALATION_SPRAY | CUTANEOUS | Status: DC | PRN
  Filled 2024-08-06: qty 56

## 2024-08-06 NOTE — Progress Notes (Signed)
 Labor Progress Note   ASSESSMENT/PLAN   Betty Powell 33 y.o.   [id number]  at [redacted]w[redacted]d here for IOL for NRFHR.  FWB:  - Fetal well being assessed: Category I/II        GBS: - GBS negative  LABOR: - Latent labor, doing well. - Discussed options with Kumiko. Will recheck cervix around 1000 and perform AROM if indicated. - Pain Management: Labor support without medications and considering epidural - Anticipate SVD   Labor Progress Cervical Exam  Last 3 exams Dil Eff Sta Exam Time  5 70 -1 0602  1.5 60 -2 2000  1 60 -2 1730    Principal Problem:   Uterine contractions Active Problems:   NST (non-stress test) nonreactive   SUBJECTIVE/OBJECTIVE   SUBJECTIVE:  Betty Powell is breathing through contractions and coping well.   OBJECTIVE: Vital Signs: Patient Vitals for the past 12 hrs:  BP Temp Temp src Pulse Resp  08/06/24 0730 (!) 100/59 98 F (36.7 C) Oral 77 18  08/06/24 0724 (!) 100/59 -- -- 77 --  08/06/24 0502 122/84 98.1 F (36.7 C) Oral 95 --  08/06/24 0000 118/62 98.1 F (36.7 C) Oral 79 18    Last SVE:  Dilation: 5 Effacement (%): 70 Cervical Position: Middle Station: -1 Presentation: Vertex Exam by:: Rowland,RN  FHR:   - Baseline: 140 - Variability:   moderate - Accels: present - Decels: occasional variable Category I/II  UTERINE ACTIVITY:  Contractions: q 1-4  Eleanor CHRISTELLA Canny, CNM

## 2024-08-06 NOTE — Anesthesia Preprocedure Evaluation (Signed)
 Anesthesia Evaluation  Patient identified by MRN, date of birth, ID band Patient awake    Reviewed: Allergy & Precautions, H&P , NPO status , Patient's Chart, lab work & pertinent test results  Airway   TM Distance: <3 FB Neck ROM: full  Mouth opening: Limited Mouth Opening  Dental no notable dental hx. (+) Teeth Intact, Dental Advidsory Given   Pulmonary Current Smoker   Pulmonary exam normal        Cardiovascular negative cardio ROS Normal cardiovascular exam     Neuro/Psych Seizures -,   Anxiety        GI/Hepatic negative GI ROS, Neg liver ROS,,,  Endo/Other  negative endocrine ROS    Renal/GU negative Renal ROS  negative genitourinary   Musculoskeletal   Abdominal   Peds  Hematology  (+) Blood dyscrasia, anemia   Anesthesia Other Findings   Reproductive/Obstetrics (+) Pregnancy                              Anesthesia Physical Anesthesia Plan  ASA: 3  Anesthesia Plan: Epidural   Post-op Pain Management:    Induction:   PONV Risk Score and Plan:   Airway Management Planned:   Additional Equipment:   Intra-op Plan:   Post-operative Plan:   Informed Consent: I have reviewed the patients History and Physical, chart, labs and discussed the procedure including the risks, benefits and alternatives for the proposed anesthesia with the patient or authorized representative who has indicated his/her understanding and acceptance.     Dental Advisory Given  Plan Discussed with: Anesthesiologist  Anesthesia Plan Comments:         Anesthesia Quick Evaluation

## 2024-08-06 NOTE — Discharge Summary (Addendum)
 Postpartum Discharge Summary  Date of Service updated 08/07/2024     [name] DOB: 1991-02-17 MRN: [id number]  Date of admission: 08/05/2024 Delivery date:08/06/2024 Delivering provider: JUSTINO ELEANOR HERO Date of discharge: 08/07/2024  Admitting diagnosis: Uterine contractions [O47.9] NST (non-stress test) nonreactive [O28.8] Intrauterine pregnancy: [redacted]w[redacted]d     Secondary diagnosis:  Principal Problem:   Uterine contractions Active Problems:   NST (non-stress test) nonreactive  Additional problems: none    Discharge diagnosis: Term Pregnancy Delivered                                              Post partum procedures: none Augmentation: Pitocin  and IP Foley Complications: None  Hospital course: Induction of Labor With Vaginal Delivery   33 y.o. yo [id number] at [redacted]w[redacted]d was admitted to the hospital 08/05/2024 for induction of labor.  Indication for induction: NRNST.  Patient had an un complicated labor course. Membrane Rupture Time/Date: 1:20 PM,08/06/2024  Delivery Method:Vaginal, Spontaneous Operative Delivery:N/A Episiotomy: None Lacerations:  NoneNone Details of delivery can be found in separate delivery note.  Patient had an uncomplicated course. Patient is discharged home 08/07/2024.  Newborn Data: Birth date:08/06/2024 Birth time:1:50 PM Gender:Female Living status:LivingLiving Apgars:8 8, 99  Weight:3130 g3130 g  Magnesium Sulfate received: No BMZ received: No Rhophylac:No MMR:N/A T-DaP:Given prenatally Flu: No RSV Vaccine received: No Transfusion:No Immunizations administered: none Immunization History  Administered Date(s) Administered   Tdap 06/12/2024    Physical exam  Vitals:   08/06/24 1956 08/06/24 2356 08/07/24 0345 08/07/24 0830  BP: 112/60 (!) 100/57 103/63 (!) 97/53  Pulse: 66 70 69 (!) 57  Resp: 18 20 18 17   Temp: 98.1 F (36.7 C) 97.9 F (36.6 C) 97.7 F (36.5 C) 97.8 F (36.6 C)  TempSrc: Oral Oral Oral Oral  SpO2:  99% 99% 100% 100%  Weight:      Height:       General: alert, cooperative, and no distress Lochia: appropriate Uterine Fundus: firm DVT Evaluation: No evidence of DVT seen on physical exam. Labs: Lab Results  Component Value Date   WBC 12.7 (H) 08/07/2024   HGB 10.0 (L) 08/07/2024   HCT 29.4 (L) 08/07/2024   MCV 102.8 (H) 08/07/2024   PLT 219 08/07/2024      Latest Ref Rng & Units 02/28/2018    1:53 PM  CMP  Glucose 65 - 99 mg/dL 867   BUN 6 - 20 mg/dL 10   Creatinine 9.55 - 1.00 mg/dL 9.22   Sodium 864 - 854 mmol/L 134   Potassium 3.5 - 5.1 mmol/L 3.4   Chloride 101 - 111 mmol/L 100   CO2 22 - 32 mmol/L 28   Calcium 8.9 - 10.3 mg/dL 8.5   Total Protein 6.5 - 8.1 g/dL 7.4   Total Bilirubin 0.3 - 1.2 mg/dL 0.6   Alkaline Phos 38 - 126 U/L 97   AST 15 - 41 U/L 24   ALT 14 - 54 U/L 23    Edinburgh Score:    08/06/2024    5:57 PM  Edinburgh Postnatal Depression Scale Screening Tool  I have been able to laugh and see the funny side of things. 0  I have looked forward with enjoyment to things. 0  I have blamed myself unnecessarily when things went wrong. 2  I have been anxious or worried for  no good reason. 1  I have felt scared or panicky for no good reason. 0  Things have been getting on top of me. 0  I have been so unhappy that I have had difficulty sleeping. 0  I have felt sad or miserable. 0  I have been so unhappy that I have been crying. 0  The thought of harming myself has occurred to me. 0  Edinburgh Postnatal Depression Scale Total 3      After visit meds:  Allergies as of 08/07/2024       Reactions   Nicoderm [nicotine ] Hives, Itching, Rash   Tape Rash        Medication List     TAKE these medications    acetaminophen  325 MG tablet Commonly known as: Tylenol  Take 2 tablets (650 mg total) by mouth every 4 (four) hours as needed (for pain scale < 4).   ferrous sulfate  325 (65 FE) MG tablet Take 1 tablet (325 mg total) by mouth daily.    ibuprofen  600 MG tablet Commonly known as: ADVIL  Take 1 tablet (600 mg total) by mouth every 6 (six) hours.   multivitamin-prenatal 27-0.8 MG Tabs tablet Take 1 tablet by mouth daily at 12 noon.   polyethylene glycol 17 g packet Commonly known as: MiraLax  Take 17 g by mouth daily.         Discharge home in stable condition Infant Feeding: Breast Infant Disposition:home with mother Discharge instruction: per After Visit Summary and Postpartum booklet. Activity: Advance as tolerated. Pelvic rest for 6 weeks.  Diet: routine diet Anticipated Birth Control: Condoms, NFP Postpartum Appointment: 6 weeks Additional Postpartum F/U: Postpartum Depression checkup Future Appointments:  No future appointments.  Follow up Visit:  Follow-up Information     Justino Eleanor HERO, CNM. Schedule an appointment as soon as possible for a visit.   Specialty: Obstetrics Why: Video visit in 2 weeks Office visit in 6 weeks Contact information: [address] [place] [address] [phone]                     08/07/2024 Colene Neighbor, SNM and [employer], CNM

## 2024-08-06 NOTE — Anesthesia Procedure Notes (Signed)
 Epidural Patient location during procedure: OB Start time: 08/06/2024 12:02 PM End time: 08/06/2024 12:26 PM  Staffing Anesthesiologist: Dario Barter, MD Resident/CRNA: Dominica Krabbe, CRNA Performed: resident/CRNA   Preanesthetic Checklist Completed: patient identified, IV checked, site marked, risks and benefits discussed, surgical consent, monitors and equipment checked, pre-op evaluation and timeout performed  Epidural Patient position: sitting Prep: ChloraPrep Patient monitoring: heart rate, continuous pulse ox and blood pressure Approach: midline Location: L3-L4 Injection technique: LOR saline  Needle:  Needle insertion depth: 7.5 cm Needle type: Tuohy  Needle gauge: 17 G Needle length: 9 cm and 9 Needle insertion depth: 7.5 cm Catheter type: closed end flexible Catheter size: 19 Gauge Catheter at skin depth: 12 cm Test dose: negative and 1.5% lidocaine  with Epi 1:200 K  Assessment Sensory level: T10 Events: blood not aspirated, no cerebrospinal fluid, injection not painful, no injection resistance, no paresthesia and negative IV test  Additional Notes 2 attempt Pt. Evaluated and documentation done after procedure finished. Patient identified. Risks/Benefits/Options discussed with patient including but not limited to bleeding, infection, nerve damage, paralysis, failed block, incomplete pain control, headache, blood pressure changes, nausea, vomiting, reactions to medication both or allergic, itching and postpartum back pain. Confirmed with bedside nurse the patient's most recent platelet count. Confirmed with patient that they are not currently taking any anticoagulation, have any bleeding history or any family history of bleeding disorders. Patient expressed understanding and wished to proceed. All questions were answered. Sterile technique was used throughout the entire procedure. Please see nursing notes for vital signs. Test dose was given through epidural  catheter and negative prior to continuing to dose epidural or start infusion. Warning signs of high block given to the patient including shortness of breath, tingling/numbness in hands, complete motor block, or any concerning symptoms with instructions to call for help. Patient was given instructions on fall risk and not to get out of bed. All questions and concerns addressed with instructions to call with any issues or inadequate analgesia.    Patient tolerated the insertion well without immediate complications.Reason for block:procedure for pain

## 2024-08-07 ENCOUNTER — Other Ambulatory Visit

## 2024-08-07 ENCOUNTER — Encounter: Admitting: Certified Nurse Midwife

## 2024-08-07 DIAGNOSIS — Z3483 Encounter for supervision of other normal pregnancy, third trimester: Secondary | ICD-10-CM

## 2024-08-07 DIAGNOSIS — Z3A39 39 weeks gestation of pregnancy: Secondary | ICD-10-CM

## 2024-08-07 LAB — CBC
HCT: 29.4 % — ABNORMAL LOW (ref 36.0–46.0)
Hemoglobin: 10 g/dL — ABNORMAL LOW (ref 12.0–15.0)
MCH: 35 pg — ABNORMAL HIGH (ref 26.0–34.0)
MCHC: 34 g/dL (ref 30.0–36.0)
MCV: 102.8 fL — ABNORMAL HIGH (ref 80.0–100.0)
Platelets: 219 K/uL (ref 150–400)
RBC: 2.86 MIL/uL — ABNORMAL LOW (ref 3.87–5.11)
RDW: 12.6 % (ref 11.5–15.5)
WBC: 12.7 K/uL — ABNORMAL HIGH (ref 4.0–10.5)
nRBC: 0 % (ref 0.0–0.2)

## 2024-08-07 MED ORDER — IBUPROFEN 600 MG PO TABS: 600.0000 mg | ORAL_TABLET | Freq: Four times a day (QID) | ORAL | 0 refills | Status: AC

## 2024-08-07 MED ORDER — ACETAMINOPHEN 325 MG PO TABS: 650.0000 mg | ORAL_TABLET | ORAL | Status: AC | PRN

## 2024-08-07 NOTE — Anesthesia Postprocedure Evaluation (Signed)
 Anesthesia Post Note  Patient: Betty Powell  Procedure(s) Performed: AN AD HOC LABOR EPIDURAL  Patient location during evaluation: Mother Baby Anesthesia Type: Epidural Level of consciousness: awake and alert Pain management: pain level controlled Vital Signs Assessment: post-procedure vital signs reviewed and stable Respiratory status: spontaneous breathing Cardiovascular status: stable Postop Assessment: no headache, no backache, patient able to bend at knees, no apparent nausea or vomiting, able to ambulate and adequate PO intake Anesthetic complications: no   No notable events documented.   Last Vitals:  Vitals:   08/06/24 2356 08/07/24 0345  BP: (!) 100/57 103/63  Pulse: 70 69  Resp: 20 18  Temp: 36.6 C 36.5 C  SpO2: 99% 100%    Last Pain:  Vitals:   08/07/24 9391  TempSrc:   PainSc: 7                  Lorriane Romero FALCON

## 2024-08-07 NOTE — Lactation Note (Signed)
 This note was copied from a baby's chart. Lactation Consultation Note  [name] Date: 08/07/2024 Age:33 hours Reason for consult: Initial assessment;Term;Other (Comment) (Discharge Education)   Maternal Data Initial assessment and discharge education done w/ a 21hr old baby Betty and parents.  This was a SVD.  Patient w/ a hx of tobacco use, anemia and anxiety.  Patient verbalized that feedings were going very well.  She didn't have any concerns or questions at the moment.   Feeding Mother's Current Feeding Choice: Breast Milk  No feeding observed.  Mom had just completed a feeding prior to lactation entering room.  Interventions Interventions: Breast feeding basics reviewed;Education;CDC milk storage guidelines;CDC Guidelines for Breast Pump Cleaning  LC provided education on the following;  milk production expectations, hunger cues, day 1/2 wet/dirty diapers, hand expression, cluster feeding, benefits of STS and arousing infant for a feeding.  Lactation informed patient of feeding infant at least 8 or more times w/in a 24hr period but not exceeding 3hrs. Patient verbalized understanding.   Discharge Discharge Education: Engorgement and breast care;Warning signs for feeding baby;Outpatient recommendation Pump: Personal;Hands [employer]ner;DEBP  Education on engorgement prevention/treatment was discussed as well as breastmilk storage guidelines.  LC provided patient with a handout on breastmilk storage guidelines from [facility]. [facility] outpatient lactation services phone number written on the white board in the room.  Patient verbalized understanding.  LC also provided education from the postpartum book about warning signs to look for in a poor feeding.    Consult Status Consult Status: Complete    Betty Powell 08/07/2024, 12:35 PM

## 2024-08-07 NOTE — Progress Notes (Signed)
 Patient discharged home with family. Discharge instructions, when to follow up, and prescriptions reviewed with patient. Patient verbalized understanding. Patient will be escorted out by auxiliary.

## 2024-08-14 ENCOUNTER — Encounter: Payer: Self-pay | Admitting: Certified Nurse Midwife

## 2024-09-03 ENCOUNTER — Telehealth (INDEPENDENT_AMBULATORY_CARE_PROVIDER_SITE_OTHER): Admitting: Obstetrics

## 2024-09-03 NOTE — Progress Notes (Signed)
   Telehealth Visit   I connected with Betty Powell  on 09/03/24 4:23 PM on the phone after the video visit link did not successfully connect.   Location: Patient: home Provider: [facility] clinic   I discussed the limitations of evaluation and management by telemedicine and the availability of in person appointments. The patient expressed understanding and agreed to proceed. Subjective:    Subjective  Betty Powell is a 33 y.o. female who presents for a postpartum visit. She is3 weeks  postpartum following a spontaneous vaginal delivery. I have fully reviewed the prenatal and intrapartum course. The delivery was at [redacted]w[redacted]d.  Anesthesia: epidural.    Postpartum course has been normal. She has no pain and no other concerns. Baby's course has been normal. Baby is feeding by  breast. Bleeding light, almost gone. Bowel function is normal. Bladder function is normal.  .  Contraception method: LAM/NFP. Postpartum depression screening: negative -Feeling some anxiety about leaving the baby, working through it     Review of Systems Pertinent positives noted in HPI. Remainder of comprehensive ROS otherwise negative.     Objective:      Objective [] Expand by Default There were no vitals taken for this visit.  General:  alert, cooperative, and no distress        Assessment:    Normal postpartum course, mood stable.   Plan:      Plan -Anticipatory guidance about the postpartum period -Reviewed s/s of PPD and PPA -Discussed when to seek medical care -Office visit at 6 weeks postpartum  I discussed the assessment and treatment plan with the patient. The patient was provided an opportunity to ask questions and all were answered. The patient agreed with the plan and demonstrated an understanding of the instructions.   The patient was advised to call back or seek an in-person evaluation if the symptoms worsen or if the condition fails to improve as anticipated.   I provided 7  minutes of non-face-to-face time during this encounter.     Praveen Coia M Mariette Cowley, CNM

## 2024-09-22 ENCOUNTER — Ambulatory Visit: Admitting: Obstetrics

## 2024-09-25 NOTE — Patient Instructions (Signed)

## 2024-09-25 NOTE — Progress Notes (Signed)
   OBSTETRICS POSTPARTUM CLINIC PROGRESS NOTE  Subjective:     Betty Powell is a 33 y.o. [phone] female who presents for a postpartum visit. She is 6 weeks postpartum following a spontaneous vaginal delivery. I have fully reviewed the prenatal and intrapartum course. The delivery was at 39.5 gestational weeks.  Anesthesia: epidural. Postpartum course has been normal. Baby's course has been good. Baby is feeding by breast. Bleeding: patient has not resumed menses, with Patient's last menstrual period was 10/15/2023 (exact date).. Bowel function is normal. Bladder function is normal. Patient is sexually active. 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 Postpartum depression screening: negative.  EDPS score is 3.    The following portions of the patient's history were reviewed and updated as appropriate: allergies, current medications, past family history, past medical history, past social history, past surgical history, and problem list.  Review of Systems Pertinent items are noted in HPI.   Objective:    BP 104/75   Pulse 79   Resp 16   Ht 5' 4 (1.626 m)   Wt 156 lb 8 oz (71 kg)   LMP 10/15/2023 (Exact Date)   Breastfeeding Yes   BMI 26.86 kg/m   General:  alert and no distress   Breasts:  inspection negative, no nipple discharge or bleeding, no masses or nodularity palpable  Lungs: clear to auscultation bilaterally  Heart:  regular rate and rhythm, S1, S2 normal, no murmur, click, rub or gallop  Abdomen: soft, non-tender; bowel sounds normal; no masses,  no organomegaly.  Well healed Pfannenstiel incision   Vulva:  normal  Vagina: normal vagina, no discharge, exudate, lesion, or erythema  Cervix:  no cervical motion tenderness and no lesions  Corpus: normal size, contour, position, consistency, mobility, non-tender  Adnexa:  normal adnexa and no mass, fullness, tenderness  Rectal Exam: Not performed.         Labs:  Lab Results  Component Value Date   HGB  12.9 09/26/2024     Assessment:   1. Postpartum care following vaginal delivery   2. Anemia, unspecified type   3. Possible pregnancy, not confirmed      Plan:    1. Contraception: Natural family planning. Considering Mirena IUD for cycle management.  2. Will check Hgb for h/o postpartum anemia of less than 10.  3. Follow up in: 1 year or as needed.    Damien Parsley, CNM [facility]

## 2024-09-26 ENCOUNTER — Ambulatory Visit (INDEPENDENT_AMBULATORY_CARE_PROVIDER_SITE_OTHER): Admitting: Certified Nurse Midwife

## 2024-09-26 DIAGNOSIS — Z3202 Encounter for pregnancy test, result negative: Secondary | ICD-10-CM

## 2024-09-26 DIAGNOSIS — D649 Anemia, unspecified: Secondary | ICD-10-CM | POA: Diagnosis not present

## 2024-09-26 DIAGNOSIS — Z3200 Encounter for pregnancy test, result unknown: Secondary | ICD-10-CM

## 2024-09-26 LAB — POCT HEMOGLOBIN: Hemoglobin: 12.9 g/dL (ref 11–14.6)

## 2024-09-26 LAB — POCT URINE PREGNANCY: Preg Test, Ur: NEGATIVE

## 2024-10-24 ENCOUNTER — Ambulatory Visit: Admitting: Obstetrics

## 2024-12-12 ENCOUNTER — Telehealth: Payer: Self-pay

## 2024-12-12 ENCOUNTER — Other Ambulatory Visit: Payer: Self-pay

## 2024-12-12 MED ORDER — DICLOXACILLIN SODIUM 500 MG PO CAPS: 500.0000 mg | ORAL_CAPSULE | Freq: Four times a day (QID) | ORAL | 0 refills | Status: AC

## 2024-12-12 NOTE — Telephone Encounter (Signed)
 Patient called with concerns of having a painful lump/knot in her breast while breastfeeding 55 month old. She reports fatigue and body aches, breast fullness, firmness with pain and tenderness and shooting pain in her nipples. She denies fever, cracked or blistered nipples. Dicloxacillin  was sent to her pharmacy. Advised according to  Mastitis protocol.
